# Patient Record
Sex: Male | Born: 1995 | Race: White | Hispanic: No | Marital: Single | Smoking: Never smoker
Health system: Southern US, Community
[De-identification: ages and names within clinical notes are randomized; demographics above are authoritative.]

## PROBLEM LIST (undated history)

## (undated) DIAGNOSIS — J189 Pneumonia, unspecified organism: Secondary | ICD-10-CM

## (undated) DIAGNOSIS — J45909 Unspecified asthma, uncomplicated: Secondary | ICD-10-CM

## (undated) DIAGNOSIS — D573 Sickle-cell trait: Secondary | ICD-10-CM

## (undated) DIAGNOSIS — G4730 Sleep apnea, unspecified: Secondary | ICD-10-CM

## (undated) DIAGNOSIS — T7840XA Allergy, unspecified, initial encounter: Secondary | ICD-10-CM

## (undated) DIAGNOSIS — I10 Essential (primary) hypertension: Secondary | ICD-10-CM

## (undated) HISTORY — DX: Allergy, unspecified, initial encounter: T78.40XA

## (undated) HISTORY — PX: TONSILLECTOMY AND ADENOIDECTOMY: SUR1326

## (undated) HISTORY — DX: Sickle-cell trait: D57.3

## (undated) HISTORY — DX: Essential (primary) hypertension: I10

## (undated) HISTORY — DX: Unspecified asthma, uncomplicated: J45.909

## (undated) HISTORY — DX: Sleep apnea, unspecified: G47.30

## (undated) HISTORY — PX: FRACTURE SURGERY: SHX138

---
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
## 2014-02-27 ENCOUNTER — Ambulatory Visit (INDEPENDENT_AMBULATORY_CARE_PROVIDER_SITE_OTHER): Payer: BC Managed Care – PPO | Admitting: Family Medicine

## 2014-02-27 ENCOUNTER — Encounter: Payer: Self-pay | Admitting: Family Medicine

## 2014-02-27 VITALS — BP 159/107 | HR 110 | Temp 98.0°F | Ht 65.75 in | Wt 312.0 lb

## 2014-02-27 DIAGNOSIS — R0681 Apnea, not elsewhere classified: Secondary | ICD-10-CM

## 2014-02-27 DIAGNOSIS — I10 Essential (primary) hypertension: Secondary | ICD-10-CM

## 2014-02-27 DIAGNOSIS — R5383 Other fatigue: Secondary | ICD-10-CM

## 2014-02-27 DIAGNOSIS — Z139 Encounter for screening, unspecified: Secondary | ICD-10-CM

## 2014-02-27 DIAGNOSIS — R5381 Other malaise: Secondary | ICD-10-CM

## 2014-02-27 MED ORDER — LISINOPRIL 10 MG PO TABS: 10.0000 mg | ORAL_TABLET | Freq: Every day | ORAL | Status: DC

## 2014-02-27 NOTE — Progress Notes (Signed)
   Subjective:    Patient ID: Mason Walker, male    DOB: 24-May-1996, 18 y.o.   MRN: [id number]  HPI C/o sleep problems and loud snoring and sleep apnea and he is accompanied by his mother Who states she has witnessed him stop breathing for 15 to 20 seconds.  He is falling asleep throughout the day and in the interview he falls asleep a few times inbetween talking to him and his mother.   Review of Systems C/o snoring, somnolence   No chest pain, SOB, HA, dizziness, vision change, N/V, diarrhea, constipation, dysuria, urinary urgency or frequency, myalgias, arthralgias or rash.  Objective:   Physical Exam  Vital signs noted  Tired appearing obese male in NAD.  HEENT - Head atraumatic Normocephalic                Eyes - PERRLA, Conjuctiva - clear Sclera- Clear EOMI                Ears - EAC's Wnl TM's Wnl Gross Hearing WNL                Throat - oropharanx wnl Respiratory - Lungs CTA bilateral Cardiac - RRR S1 and S2 without murmur GI - Abdomen soft Nontender and bowel sounds active x 4 Extremities - No edema. Neuro - Grossly intact.      Assessment & Plan:  Fatigue - Plan: POCT CBC, CMP14+EGFR, Vit D  25 hydroxy (rtn osteoporosis monitoring), Thyroid Panel With TSH  Apnea - Plan: Ambulatory referral to Pulmonology, CANCELED: Ambulatory referral to Pulmonology  Screening - Plan: Lipid panel  Essential hypertension, benign - Plan: lisinopril (PRINIVIL,ZESTRIL) 10 MG tablet  Lysbeth Penner FNP

## 2014-02-28 ENCOUNTER — Encounter: Payer: Self-pay | Admitting: Pulmonary Disease

## 2014-02-28 ENCOUNTER — Ambulatory Visit (INDEPENDENT_AMBULATORY_CARE_PROVIDER_SITE_OTHER): Payer: BC Managed Care – PPO | Admitting: Pulmonary Disease

## 2014-02-28 ENCOUNTER — Encounter (INDEPENDENT_AMBULATORY_CARE_PROVIDER_SITE_OTHER): Payer: Self-pay

## 2014-02-28 VITALS — BP 148/98 | HR 112 | Temp 98.6°F | Ht 66.0 in | Wt 315.4 lb

## 2014-02-28 DIAGNOSIS — G4733 Obstructive sleep apnea (adult) (pediatric): Secondary | ICD-10-CM

## 2014-02-28 NOTE — Patient Instructions (Signed)
Will schedule for home sleep testing, and will call once the results are available. Work on weight loss.  

## 2014-02-28 NOTE — Progress Notes (Signed)
Subjective:    Patient ID: Mason CelesteJacob Walker, male    DOB: 03/06/1996, 18 y.o.   MRN: [id number]  HPI The patient is a 18 year old male who I've been asked to see for possible obstructive sleep apnea. The patient has been noted to have loud snoring as well as witnessed apneas by his parent. He also describes choking arousals at night, sometimes associated with severe coughing and regurgitation. He has frequent awakenings at night, and is not rested in the mornings upon arising. He has profound sleepiness during the day with inactivity, and falls asleep in class. His mother has received e-mails from his teachers in college. He will also fall asleep in the evenings watching television or movies, and also has sleepiness driving longer distances. Of note, the patient states his weight is up 80 pounds over the last 2 years, and his Epworth score today is 14.    Sleep Questionnaire What time do you typically go to bed?( Between what hours) 10-11pm 10-11pm at 1615 on 02/28/14 by Darrell JewelJennifer R Castillo, CMA How long does it take you to fall asleep? 1 hour 1 hour at 1615 on 02/28/14 by Darrell JewelJennifer R Castillo, CMA How many times during the night do you wake up? 4 4 at 1615 on 02/28/14 by Darrell JewelJennifer R Castillo, CMA What time do you get out of bed to start your day? 0800 0800 at 1615 on 02/28/14 by Darrell JewelJennifer R Castillo, CMA Do you drive or operate heavy machinery in your occupation? No No at 1615 on 02/28/14 by Darrell JewelJennifer R Castillo, CMA How much has your weight changed (up or down) over the past two years? (In pounds) 80 lb (36.288 kg) 80 lb (36.288 kg) at 1615 on 02/28/14 by Darrell JewelJennifer R Castillo, CMA Have you ever had a sleep study before? No No at 1615 on 02/28/14 by Darrell JewelJennifer R Castillo, CMA Do you currently use CPAP? No No at 1615 on 02/28/14 by Darrell JewelJennifer R Castillo, CMA Do you wear oxygen at any time? No No at 1615 on 02/28/14 by Darrell JewelJennifer R Castillo, CMA   Review of Systems  Constitutional:  Negative for fever and unexpected weight change.  HENT: Positive for congestion. Negative for dental problem, ear pain, nosebleeds, postnasal drip, rhinorrhea, sinus pressure, sneezing, sore throat and trouble swallowing.   Eyes: Negative for redness and itching.  Respiratory: Positive for shortness of breath. Negative for cough, chest tightness and wheezing.   Cardiovascular: Negative for palpitations and leg swelling.  Gastrointestinal: Negative for nausea and vomiting.  Genitourinary: Negative for dysuria.  Musculoskeletal: Negative for joint swelling.  Skin: Negative for rash.  Neurological: Negative for headaches.  Hematological: Does not bruise/bleed easily.  Psychiatric/Behavioral: Positive for dysphoric mood. The patient is nervous/anxious.        Objective:   Physical Exam Constitutional:  Morbidly obese male, no acute distress  HENT:  Nares patent without discharge, mild edema  Oropharynx without exudate, palate and uvula are moderately elongated.   Eyes:  Perrla, eomi, no scleral icterus  Neck:  No JVD, no TMG  Cardiovascular:  Normal rate, regular rhythm, no rubs or gallops.  No murmurs        Intact distal pulses  Pulmonary :  Normal breath sounds, no stridor or respiratory distress   No rales, rhonchi, or wheezing  Abdominal:  Soft, nondistended, bowel sounds present.  No tenderness noted.   Musculoskeletal:  No lower extremity edema noted.  Lymph Nodes:  No cervical lymphadenopathy noted  Skin:  No cyanosis noted  Neurologic:  Appears sleepy but appropriate, moves all 4 extremities without obvious deficit.         Assessment & Plan:

## 2014-02-28 NOTE — Assessment & Plan Note (Signed)
The patient's history is classic for clinically significant obstructive sleep apnea. I've had a long discussion with him about the pathophysiology of sleep apnea, including its impact to his quality of life and cardiovascular health. He will need a sleep study for diagnosis, and he is an excellent candidate for home sleep testing

## 2014-05-06 ENCOUNTER — Telehealth: Payer: Self-pay | Admitting: Pulmonary Disease

## 2014-05-06 NOTE — Telephone Encounter (Signed)
I had called & spoke with pt on 03/29/14 to try to set up his HST appt.  Pt stated that he thought he already had his CPAP & that he thought "we skipped this step".  However, he stated he would check to be sure he did already have a CPAP machine & call me back.  Pt was given my direct phone number.  On 04/04/14 I tried to reach pt but got VM on both his home & cell numbers.  I left a VM asking him to call back to verify if he did have his CPAP and if not, I would schedule an appt for him to pick up one of our machines.   I had not received any call back or messages from the patient regarding if he still needed to schedule a HST or if he already has a CPAP.  I called the pt today (05/06/14) on his cell and reached him.  He stated that he does already have his CPAP.  He obtained this through a DME in [place] and this was ordered by "one of [facility] neurolog[facility]".  I told pt we will cancel the HST order for here since he already has his machine.  Lucilla Edinawne J Law

## 2014-05-06 NOTE — Telephone Encounter (Signed)
Cannot prescribe any cpap until I have his sleep study from [facility] for review.

## 2014-05-07 NOTE — Telephone Encounter (Signed)
I have LMTCBx1. If the pt already has a CPAP from another MD is he going to f/u here? If he had sleep study and was prescribed cpap through another MD then he should follow-up with them? Carron CurieJennifer Castillo, CMA

## 2014-05-07 NOTE — Telephone Encounter (Signed)
I spoke with pt mother and she states he went to [employer] [facility] and they [facility] and he has followed with them for cpap. She was not aware that any of this had been done at the time of his OV with Dr. Shelle Ironlance. [facility] is [facility]. Nothing further needed here. Carron CurieJennifer Ashon Rosenberg, CMA

## 2016-02-12 ENCOUNTER — Encounter: Payer: Self-pay | Admitting: Family Medicine

## 2016-02-12 ENCOUNTER — Ambulatory Visit (INDEPENDENT_AMBULATORY_CARE_PROVIDER_SITE_OTHER): Payer: BC Managed Care – PPO | Admitting: Family Medicine

## 2016-02-12 VITALS — BP 124/66 | HR 105 | Temp 97.5°F | Ht 66.0 in | Wt 326.4 lb

## 2016-02-12 DIAGNOSIS — F39 Unspecified mood [affective] disorder: Secondary | ICD-10-CM

## 2016-02-12 DIAGNOSIS — R002 Palpitations: Secondary | ICD-10-CM | POA: Diagnosis not present

## 2016-02-12 NOTE — Progress Notes (Signed)
   HPI  Patient presents today here requesting ADHD medications.  Patient explains that in 2060 quit taking ADHD medications and since that time he struggled with inattention.  He was previously a [employer] due to when he had to leave for mental health reasons. He states that at that time he was having suicidal thoughts but have no attempts. He's been treated with a mood stabilizer previously but never told that he had bipolar disorder, anxiety, or depression.  He was previously treated with stimulants for ADHD.  He complains of inattention trying to read. Also some fidgeting and hyperactivity. He is willing to go psychiatry.  He endorses passive suicidal thoughts remotely, he states that he is not having any of those thoughts currently, he's never had any overt suicidal attempts, and he does not want to kill himself. He checks for safety  Palpitations Describes long-term issues with palpitations, describes sensation of heart stopping and then pounding back to life. He states that he does have mild shortness of breath with these episodes and chest pain with the first beat when he returns. He denies any syncopal episode, sweating He has no exertional chest pain, shortness of breath, or palpitations These episodes occur at random but are pretty frequent happening approximately once every 2 days or so  PMH: Smoking status noted ROS: Per HPI  Objective: BP 124/66 mmHg  Pulse 105  Temp(Src) 97.5 F (36.4 C) (Oral)  Ht 5\' 6"  (1.676 m)  Wt 326 lb 6.4 oz (148.054 kg)  BMI 52.71 kg/m2 Gen: NAD, alert, cooperative with exam HEENT: NCAT, EOMI, PERRL CV: RRR, good S1/S2, no murmur Resp: CTABL, no wheezes, non-labored Abd: SNTND, BS present, no guarding or organomegaly Ext: No edema, warm Neuro: Alert and oriented, No gross deficits  Psych: Appropriate mood and affect, contracts for safety, passive suicidal thoughts in the past  [facility] 02/12/2016 02/27/2014  Decreased  Interest 2 0  Down, Depressed, Hopeless 2 1  PHQ - 2 Score 4 1  Altered sleeping 3 -  Tired, decreased energy 3 -  Change in appetite 3 -  Feeling bad or failure about yourself  3 -  Trouble concentrating 3 -  Moving slowly or fidgety/restless 3 -  Suicidal thoughts 2 -  PHQ-9 Score 24 -      Assessment and plan:  # Mood disorder Possibly has ADHD, however I am much more concerned about severe depression or bipolar disorder I given him the name of a local psychiatrist as well as a list of local providers, refer to psychiatry He contracts for safety, follow-up as needed  # Palpitations Likely anxiety related Offered EKG labs and Holter monitor today today he is very concerned about the cost, he will meet with our financial department and get back to me if he would like workup. The meantime I have reviewed red flags and reasons to seek emergency medical care which he understands   Orders Placed This Encounter  Procedures  . Ambulatory referral to Psychiatry    Referral Priority:  Routine    Referral Type:  Psychiatric    Referral Reason:  Specialty Services Required    Requested Specialty:  Psychiatry    Number of Visits Requested:  1    No orders of the defined types were placed in this encounter.    Murtis SinkSam Arneta Mahmood, MD [facility] 02/12/2016, 8:40 AM

## 2016-02-12 NOTE — Patient Instructions (Signed)
Great to meet you!  Consider calling a psychiatrist for treatment of you rmood disorder and ADHD evaluation  Mason Walker (([phone]) [phone]) is someone we talked about, also look at the list  Come back to discuss palpitations if you are interested in pursuing more evaluation

## 2016-04-14 ENCOUNTER — Ambulatory Visit (INDEPENDENT_AMBULATORY_CARE_PROVIDER_SITE_OTHER): Payer: BC Managed Care – PPO | Admitting: Family Medicine

## 2016-04-14 ENCOUNTER — Encounter: Payer: Self-pay | Admitting: Family Medicine

## 2016-04-14 VITALS — BP 119/79 | HR 77 | Temp 97.3°F | Ht 66.0 in | Wt 334.0 lb

## 2016-04-14 DIAGNOSIS — Z111 Encounter for screening for respiratory tuberculosis: Secondary | ICD-10-CM | POA: Diagnosis not present

## 2016-04-14 DIAGNOSIS — F900 Attention-deficit hyperactivity disorder, predominantly inattentive type: Secondary | ICD-10-CM

## 2016-04-14 DIAGNOSIS — Z0000 Encounter for general adult medical examination without abnormal findings: Secondary | ICD-10-CM

## 2016-04-14 MED ORDER — LISDEXAMFETAMINE DIMESYLATE 30 MG PO CAPS: 30.0000 mg | ORAL_CAPSULE | Freq: Every day | ORAL | Status: DC

## 2016-04-14 MED ORDER — TUBERCULIN PPD 5 UNIT/0.1ML ID SOLN
5.0000 [IU] | Freq: Once | INTRADERMAL | Status: AC
  Administered 2016-04-14: 5 [IU] via INTRADERMAL

## 2016-04-14 NOTE — Progress Notes (Signed)
   Subjective:    Patient ID: Mason Walker, male    DOB: 03-May-1996, 20 y.o.   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 The form is fairly standard for a general exam. He also asked for medicine for ADHD. He has never seen psychiatry. I let him complete the form of a screening form and he rates high enough to be suspicious for ADHD. Basically he has problems completing assignments and reading  . He has never been treated for this before.  Patient Active Problem List   Diagnosis Date Noted  . Mood disorder (HCC) 02/12/2016  . OSA (obstructive sleep apnea) 02/28/2014   No outpatient encounter prescriptions on file as of 04/14/2016.   No facility-administered encounter medications on file as of 04/14/2016.      Review of Systems  Psychiatric/Behavioral: Positive for decreased concentration.       Objective:   Physical Exam  Constitutional: He is oriented to person, place, and time. He appears well-developed.  Very overweight  HENT:  Head: Normocephalic.  Neck: Normal range of motion.  Cardiovascular: Normal rate, regular rhythm, normal heart sounds and intact distal pulses.   Pulmonary/Chest: Breath sounds normal.  Abdominal: Soft. There is no tenderness.  Musculoskeletal: Normal range of motion.  Neurological: He is alert and oriented to person, place, and time. He has normal reflexes.  Psychiatric: He has a normal mood and affect.   BP 119/79 mmHg  Pulse 77  Temp(Src) 97.3 F (36.3 C) (Oral)  Ht 5\' 6"  (1.676 m)  Wt 334 lb (151.501 kg)  BMI 53.93 kg/m2        Assessment & Plan:  1. Routine general medical examination at a health care facility Within normal limits except for obesity. He tells me this is a familial finding - tuberculin injection 5 Units; Inject 0.1 mLs (5 Units total) into the skin once.  2. Attention deficit hyperactivity disorder (ADHD), predominantly inattentive type He tells  me he cannot afford psychiatric evaluation and would like to try medicine for ADHD. Screening tool suggests he does have a problem so I will begin Vyvanse 30 mg daily and see him back in one month  Frederica KusterStephen M Miller MD

## 2016-04-16 ENCOUNTER — Encounter: Payer: Self-pay | Admitting: *Deleted

## 2016-04-16 LAB — TB SKIN TEST
Induration: 0 mm
TB Skin Test: NEGATIVE

## 2016-04-16 NOTE — Addendum Note (Signed)
Addended by: Fawn KirkHOLT, CATHY on: 04/16/2016 11:57 AM   Modules accepted: Orders, SmartSet

## 2016-05-06 ENCOUNTER — Telehealth: Payer: Self-pay | Admitting: Family Medicine

## 2016-05-11 NOTE — Telephone Encounter (Signed)
Copy of Letter faxed to [facility].

## 2016-05-20 ENCOUNTER — Encounter: Payer: Self-pay | Admitting: Family Medicine

## 2016-05-20 ENCOUNTER — Ambulatory Visit (INDEPENDENT_AMBULATORY_CARE_PROVIDER_SITE_OTHER): Payer: BC Managed Care – PPO | Admitting: Family Medicine

## 2016-05-20 VITALS — BP 137/87 | HR 106 | Temp 98.8°F | Ht 66.0 in | Wt 336.0 lb

## 2016-05-20 DIAGNOSIS — F39 Unspecified mood [affective] disorder: Secondary | ICD-10-CM

## 2016-05-20 MED ORDER — LISDEXAMFETAMINE DIMESYLATE 40 MG PO CAPS: 40.0000 mg | ORAL_CAPSULE | ORAL | 0 refills | Status: DC

## 2016-05-20 MED ORDER — LISDEXAMFETAMINE DIMESYLATE 30 MG PO CAPS: 30.0000 mg | ORAL_CAPSULE | Freq: Every day | ORAL | 0 refills | Status: DC

## 2016-05-20 NOTE — Progress Notes (Signed)
   Subjective:    Patient ID: Mason Walker, male    DOB: [birth year], 20 y.o.   MRN: [id number]  HPI  Patient here today for 1 month follow up on medication.  Patient has had marked improvement in symptoms on this medicine. There are no side effects such as sleep disturbance or appetite disturbance that he is noted but generally impact has been very positive.    Patient Active Problem List   Diagnosis Date Noted  . Mood disorder (HCC) 02/12/2016  . OSA (obstructive sleep apnea) 02/28/2014   Outpatient Encounter Prescriptions as of 05/20/2016  Medication Sig  . lisdexamfetamine (VYVANSE) 30 MG capsule Take 1 capsule (30 mg total) by mouth daily.   No facility-administered encounter medications on file as of 05/20/2016.      Review of Systems  Constitutional: Negative.   HENT: Negative.   Eyes: Negative.   Respiratory: Negative.   Cardiovascular: Negative.   Gastrointestinal: Negative.   Endocrine: Negative.   Genitourinary: Negative.   Musculoskeletal: Negative.   Skin: Negative.   Allergic/Immunologic: Negative.   Neurological: Negative.   Hematological: Negative.   Psychiatric/Behavioral: Negative.        Objective:   Physical Exam  Constitutional: He is oriented to person, place, and time. He appears well-developed and well-nourished.  Cardiovascular: Normal rate, regular rhythm and normal heart sounds.   Pulmonary/Chest: Effort normal and breath sounds normal.  Neurological: He is alert and oriented to person, place, and time.  Psychiatric: He has a normal mood and affect. His behavior is normal.    BP 137/87 (BP Location: Left Arm)   Pulse (!) 106   Temp 98.8 F (37.1 C) (Oral)   Ht 5\' 6"  (1.676 m)   Wt (!) 336 lb (152.4 kg)   BMI 54.23 kg/m        Assessment & Plan:  1. Mood disorder (HCC) This is another term for ADHD. When I asked him to do the ADHD screening tool he still reports difficulties even though he has seen a big difference. I am inclined  to increase dose of Vyvanse from 30-40 mg and follow  Frederica Kuster MD .

## 2016-08-13 ENCOUNTER — Ambulatory Visit (INDEPENDENT_AMBULATORY_CARE_PROVIDER_SITE_OTHER): Payer: BC Managed Care – PPO | Admitting: Family Medicine

## 2016-08-13 ENCOUNTER — Encounter: Payer: Self-pay | Admitting: Family Medicine

## 2016-08-13 VITALS — BP 141/92 | HR 88 | Temp 97.7°F | Ht 66.0 in | Wt 352.2 lb

## 2016-08-13 DIAGNOSIS — F39 Unspecified mood [affective] disorder: Secondary | ICD-10-CM | POA: Diagnosis not present

## 2016-08-13 DIAGNOSIS — R030 Elevated blood-pressure reading, without diagnosis of hypertension: Secondary | ICD-10-CM | POA: Diagnosis not present

## 2016-08-13 DIAGNOSIS — M25562 Pain in left knee: Secondary | ICD-10-CM

## 2016-08-13 MED ORDER — LISDEXAMFETAMINE DIMESYLATE 40 MG PO CAPS: 40.0000 mg | ORAL_CAPSULE | ORAL | 0 refills | Status: DC

## 2016-08-13 NOTE — Patient Instructions (Signed)
Continue current medications. Continue good therapeutic lifestyle changes which include good diet and exercise. Fall precautions discussed with patient. If an FOBT was given today- please return it to our front desk. If you are over 22 years old - you may need Prevnar 13 or the adult Pneumonia vaccine.   After your visit with us today you will receive a survey in the mail or online from [employer] regarding your care with us. Please take a moment to fill this out. Your feedback is very important to us as you can help us better understand your patient needs as well as improve your experience and satisfaction. WE CARE ABOUT YOU!!!    

## 2016-08-13 NOTE — Addendum Note (Signed)
Addended by: Quay BurowPOTTER, Romonda Parker K on: 08/13/2016 02:32 PM   Modules accepted: Orders

## 2016-08-13 NOTE — Progress Notes (Signed)
   Subjective:    Patient ID: Mason Walker, male    DOB: 10-Mar-1996, 20 y.o.   MRN: [id number]  HPI   Patient is here today for followup of mood disorder.  He is also concerned about pain he is having in his left knee. Patient doing well with Vyvanse. He complains of pain in his left knee on the lateral aspect. There is no grinding or giving way or locking. Symptoms seem to be related more to increase activity. Patient Active Problem List   Diagnosis Date Noted  . Mood disorder ([place]) 02/12/2016  . OSA (obstructive sleep apnea) 02/28/2014   Outpatient Encounter Prescriptions as of 08/13/2016  Medication Sig  . lisdexamfetamine (VYVANSE) 40 MG capsule Take 1 capsule (40 mg total) by mouth every morning.  . lisdexamfetamine (VYVANSE) 40 MG capsule Take 1 capsule (40 mg total) by mouth every morning.  . lisdexamfetamine (VYVANSE) 40 MG capsule Take 1 capsule (40 mg total) by mouth every morning.   No facility-administered encounter medications on file as of 08/13/2016.        Review of Systems  Constitutional: Negative.   HENT: Negative.   Eyes: Negative.   Respiratory: Negative.   Cardiovascular: Negative.   Gastrointestinal: Negative.   Endocrine: Negative.   Genitourinary: Negative.   Musculoskeletal: Positive for arthralgias.  Skin: Negative.   Allergic/Immunologic: Negative.   Neurological: Negative.   Hematological: Negative.   Psychiatric/Behavioral: Negative.           Objective:   Physical Exam  Constitutional: He appears well-developed and well-nourished.  Patient is obese  Musculoskeletal:  Left knee: There is no effusion. There is some mild tenderness along the lateral aspect. Joint stability is intact with valgus and varus stress.    BP (!) 148/90   Pulse 94   Temp 97.7 F (36.5 C) (Oral)   Ht '5\' 6"'$  (1.676 m)   Wt (!) 352 lb 4 oz (159.8 kg)   BMI 56.85 kg/m          Assessment & Plan:  1. Mood disorder ([place]) Into new with Vyvanse as  before for 3 months. Drug contract executed a day - CMP14+EGFR - Lipid panel  2. Acute pain of left knee Suspect iliotibial band syndrome. Suggested Aleve and some exercises      3. Elevated blood pressure reading Have recommended weight reduction and decrease sodium intake. He does have family history.  Wardell Honour MD

## 2016-08-21 LAB — TOXASSURE SELECT 13 (MW), URINE

## 2016-10-22 ENCOUNTER — Encounter: Payer: Self-pay | Admitting: Family Medicine

## 2016-10-22 ENCOUNTER — Ambulatory Visit (INDEPENDENT_AMBULATORY_CARE_PROVIDER_SITE_OTHER): Payer: BC Managed Care – PPO | Admitting: Family Medicine

## 2016-10-22 VITALS — BP 134/83 | HR 94 | Temp 97.2°F | Ht 66.0 in | Wt 360.0 lb

## 2016-10-22 DIAGNOSIS — F39 Unspecified mood [affective] disorder: Secondary | ICD-10-CM | POA: Diagnosis not present

## 2016-10-22 DIAGNOSIS — J0100 Acute maxillary sinusitis, unspecified: Secondary | ICD-10-CM

## 2016-10-22 MED ORDER — AMOXICILLIN 875 MG PO TABS: 875.0000 mg | ORAL_TABLET | Freq: Two times a day (BID) | ORAL | 0 refills | Status: DC

## 2016-10-22 MED ORDER — LISDEXAMFETAMINE DIMESYLATE 40 MG PO CAPS: 40.0000 mg | ORAL_CAPSULE | ORAL | 0 refills | Status: DC

## 2016-10-22 NOTE — Progress Notes (Signed)
   Subjective:    Patient ID: Mason Walker, male    DOB: 1996-03-28, 21 y.o.   MRN: [id number]  HPI Patient here today for follow up on meds and mood disorder.He has not had his Vyvanse for some time since it was accidentally thrown out.  Also complains of some congestion and cough he thinks it may be due to allergies. Also complains of ears feeling stopped up ears symptoms present for 1-1/2 weeks    Patient Active Problem List   Diagnosis Date Noted  . Mood disorder (HCC) 02/12/2016  . OSA (obstructive sleep apnea) 02/28/2014   Outpatient Encounter Prescriptions as of 10/22/2016  Medication Sig  . lisdexamfetamine (VYVANSE) 40 MG capsule Take 1 capsule (40 mg total) by mouth every morning. (Patient not taking: Reported on 10/22/2016)  . lisdexamfetamine (VYVANSE) 40 MG capsule Take 1 capsule (40 mg total) by mouth every morning. (Patient not taking: Reported on 10/22/2016)  . lisdexamfetamine (VYVANSE) 40 MG capsule Take 1 capsule (40 mg total) by mouth every morning. (Patient not taking: Reported on 10/22/2016)   No facility-administered encounter medications on file as of 10/22/2016.        Review of Systems  Constitutional: Negative.   HENT: Positive for congestion.   Eyes: Negative.   Respiratory: Positive for cough.   Cardiovascular: Negative.   Gastrointestinal: Negative.   Endocrine: Negative.   Genitourinary: Negative.   Musculoskeletal: Negative.   Skin: Negative.   Allergic/Immunologic: Negative.   Neurological: Negative.   Hematological: Negative.   Psychiatric/Behavioral: Negative.        Objective:   Physical Exam  Constitutional: He is oriented to person, place, and time. He appears well-developed and well-nourished.  HENT:  Both tympanic membranes are dull and reddened There is also thick postnasal drainage in the oropharynx  Cardiovascular: Normal rate and regular rhythm.   Pulmonary/Chest: Effort normal and breath sounds normal.  Neurological: He is  alert and oriented to person, place, and time.   BP 134/83 (BP Location: Right Wrist)   Pulse 94   Temp 97.2 F (36.2 C) (Oral)   Ht 5\' 6"  (1.676 m)   Wt (!) 360 lb (163.3 kg)   BMI 58.11 kg/m         Assessment & Plan:  1. Mood disorder (HCC) Feel Vyvanse. He says that it was effective while he took it  2. Acute non-recurrent maxillary sinusitis Amoxicillin 875 twice a day. Also recommend Flonase  Frederica KusterStephen M Miller MD

## 2018-10-18 DIAGNOSIS — J189 Pneumonia, unspecified organism: Secondary | ICD-10-CM

## 2018-10-18 HISTORY — DX: Pneumonia, unspecified organism: J18.9

## 2018-10-19 ENCOUNTER — Ambulatory Visit (INDEPENDENT_AMBULATORY_CARE_PROVIDER_SITE_OTHER): Payer: BLUE CROSS/BLUE SHIELD | Admitting: Physician Assistant

## 2018-10-19 ENCOUNTER — Encounter: Payer: Self-pay | Admitting: Physician Assistant

## 2018-10-19 VITALS — BP 145/96 | HR 102 | Temp 97.4°F | Ht 66.0 in | Wt 380.0 lb

## 2018-10-19 DIAGNOSIS — J209 Acute bronchitis, unspecified: Secondary | ICD-10-CM

## 2018-10-19 MED ORDER — AMOXICILLIN 875 MG PO TABS: 875.0000 mg | ORAL_TABLET | Freq: Two times a day (BID) | ORAL | 0 refills | Status: DC

## 2018-10-19 MED ORDER — AZITHROMYCIN 250 MG PO TABS
ORAL_TABLET | ORAL | 0 refills | Status: DC
Start: 2018-10-19 — End: 2018-11-13

## 2018-10-19 MED ORDER — PREDNISONE 10 MG (21) PO TBPK: ORAL_TABLET | ORAL | 0 refills | Status: DC

## 2018-10-20 ENCOUNTER — Ambulatory Visit: Payer: BC Managed Care – PPO | Admitting: Family Medicine

## 2018-10-22 NOTE — Progress Notes (Signed)
BP (!) 145/96   Pulse (!) 102   Temp (!) 97.4 F (36.3 C) (Oral)   Ht 5\' 6"  (1.676 m)   Wt (!) 380 lb (172.4 kg)   BMI 61.33 kg/m    Subjective:    Patient ID: Mason Walker, male    DOB: Jan 16, 1996, 23 y.o.   MRN: [id number]  HPI: Mason Walker is a 23 y.o. male presenting on 10/19/2018 for Sinusitis and Cough  Patient with several days of progressing upper respiratory and bronchial symptoms. Initially there was more upper respiratory congestion. This progressed to having significant cough that is productive throughout the day and severe at night. There is occasional wheezing after coughing. Sometimes there is slight dyspnea on exertion. It is productive mucus that is yellow in color. Denies any blood.   Past Medical History:  Diagnosis Date  . Allergy   . Asthma   . HTN (hypertension)   . Sickle cell trait (HCC)   . Sleep apnea    Relevant past medical, surgical, family and social history reviewed and updated as indicated. Interim medical history since our last visit reviewed. Allergies and medications reviewed and updated. DATA REVIEWED: CHART IN EPIC  Family History reviewed for pertinent findings.  Review of Systems  Constitutional: Positive for fatigue. Negative for appetite change and fever.  HENT: Positive for sinus pressure and sore throat.   Eyes: Negative.  Negative for pain and visual disturbance.  Respiratory: Positive for cough. Negative for chest tightness and shortness of breath.   Cardiovascular: Negative.  Negative for chest pain, palpitations and leg swelling.  Gastrointestinal: Negative.  Negative for abdominal pain, diarrhea, nausea and vomiting.  Endocrine: Negative.   Genitourinary: Negative.   Musculoskeletal: Positive for back pain and myalgias.  Skin: Negative.  Negative for color change and rash.  Neurological: Positive for headaches. Negative for weakness and numbness.  Psychiatric/Behavioral: Negative.     Allergies as of 10/19/2018   No  Known Allergies     Medication List       Accurate as of October 19, 2018 11:59 PM. Always use your most recent med list.        amoxicillin 875 MG tablet Commonly known as:  AMOXIL Take 1 tablet (875 mg total) by mouth 2 (two) times daily.   azithromycin 250 MG tablet Commonly known as:  ZITHROMAX Z-PAK Take as directed   predniSONE 10 MG (21) Tbpk tablet Commonly known as:  STERAPRED UNI-PAK 21 TAB As directed x 6 days          Objective:    BP (!) 145/96   Pulse (!) 102   Temp (!) 97.4 F (36.3 C) (Oral)   Ht 5\' 6"  (1.676 m)   Wt (!) 380 lb (172.4 kg)   BMI 61.33 kg/m   No Known Allergies  Wt Readings from Last 3 Encounters:  10/19/18 (!) 380 lb (172.4 kg)  10/22/16 (!) 360 lb (163.3 kg)  08/13/16 (!) 352 lb 4 oz (159.8 kg)    Physical Exam Constitutional:      Appearance: He is well-developed.  HENT:     Head: Normocephalic and atraumatic.     Right Ear: Hearing and tympanic membrane normal.     Left Ear: Hearing and tympanic membrane normal.     Nose: Mucosal edema present. No nasal deformity.     Right Sinus: Frontal sinus tenderness present.     Left Sinus: Frontal sinus tenderness present.     Mouth/Throat:  Pharynx: Posterior oropharyngeal erythema present.  Eyes:     General:        Right eye: No discharge.        Left eye: No discharge.     Conjunctiva/sclera: Conjunctivae normal.     Pupils: Pupils are equal, round, and reactive to light.  Neck:     Musculoskeletal: Normal range of motion and neck supple.  Cardiovascular:     Rate and Rhythm: Normal rate and regular rhythm.     Heart sounds: Normal heart sounds.  Pulmonary:     Effort: Pulmonary effort is normal. No respiratory distress.     Breath sounds: Wheezing present. No decreased breath sounds, rhonchi or rales.  Abdominal:     General: Bowel sounds are normal.     Palpations: Abdomen is soft.  Musculoskeletal: Normal range of motion.  Skin:    General: Skin is warm and  dry.     Results for orders placed or performed in visit on 08/13/16  ToxASSURE Select 13 (MW), Urine  Result Value Ref Range   ToxAssure Select 13 FINAL       Assessment & Plan:   1. Acute bronchitis, unspecified organism - amoxicillin (AMOXIL) 875 MG tablet; Take 1 tablet (875 mg total) by mouth 2 (two) times daily.  Dispense: 20 tablet; Refill: 0 - azithromycin (ZITHROMAX Z-PAK) 250 MG tablet; Take as directed  Dispense: 6 each; Refill: 0 - predniSONE (STERAPRED UNI-PAK 21 TAB) 10 MG (21) TBPK tablet; As directed x 6 days  Dispense: 21 tablet; Refill: 0   Continue all other maintenance medications as listed above.  Follow up plan: No follow-ups on file.  Educational handout given for survey  Remus Loffler PA-C [facility] [address]  [place], [place] [address] [phone]   10/22/2018, 4:15 PM

## 2018-11-13 ENCOUNTER — Ambulatory Visit (INDEPENDENT_AMBULATORY_CARE_PROVIDER_SITE_OTHER): Payer: BLUE CROSS/BLUE SHIELD | Admitting: Family Medicine

## 2018-11-13 ENCOUNTER — Encounter: Payer: Self-pay | Admitting: Family Medicine

## 2018-11-13 VITALS — BP 125/81 | HR 78 | Temp 97.1°F | Ht 66.0 in | Wt 385.0 lb

## 2018-11-13 DIAGNOSIS — H938X1 Other specified disorders of right ear: Secondary | ICD-10-CM | POA: Diagnosis not present

## 2018-11-13 DIAGNOSIS — J329 Chronic sinusitis, unspecified: Secondary | ICD-10-CM | POA: Diagnosis not present

## 2018-11-13 DIAGNOSIS — H9191 Unspecified hearing loss, right ear: Secondary | ICD-10-CM

## 2018-11-13 MED ORDER — BENZONATATE 100 MG PO CAPS: 100.0000 mg | ORAL_CAPSULE | Freq: Three times a day (TID) | ORAL | 0 refills | Status: DC | PRN

## 2018-11-13 MED ORDER — AZELASTINE HCL 0.1 % NA SOLN: 2.0000 | Freq: Two times a day (BID) | NASAL | 12 refills | Status: DC

## 2018-11-13 MED ORDER — CETIRIZINE HCL 10 MG PO TABS: 10.0000 mg | ORAL_TABLET | Freq: Every day | ORAL | 11 refills | Status: DC

## 2018-11-13 NOTE — Patient Instructions (Signed)
There is nothing on exam to suggest that you have an ongoing infection.  I think that the medications have worked.  However, your exam does demonstrate evidence of persistent drainage and therefore I have prescribed Zyrtec, Astelin nasal spray and Tessalon Perles.  The [employer]on Perles will help with the cough.  Astelin should dry up any nasal drainage you have going on and hopefully relieve some of that fullness you have in the right ear.  The Zyrtec will also help with drainage and other allergy symptoms.  Take that pill at nighttime, as it can make you sleepy.  If you feel that symptoms are not substantially improving in the next couple of weeks, contact me.  If the hearing does not improve while the other symptoms improve, contact me and I can place a referral to the audiologist.   Allergic Rhinitis, Adult Allergic rhinitis is a reaction to allergens in the air. Allergens are tiny specks (particles) in the air that cause your body to have an allergic reaction. This condition cannot be passed from person to person (is not contagious). Allergic rhinitis cannot be cured, but it can be controlled. There are two types of allergic rhinitis:  Seasonal. This type is also called hay fever. It happens only during certain times of the year.  Perennial. This type can happen at any time of the year. What are the causes? This condition may be caused by:  Pollen from grasses, trees, and weeds.  House dust mites.  Pet dander.  Mold. What are the signs or symptoms? Symptoms of this condition include:  Sneezing.  Runny or stuffy nose (nasal congestion).  A lot of mucus in the back of the throat (postnasal drip).  Itchy nose.  Tearing of the eyes.  Trouble sleeping.  Being sleepy during day. How is this treated? There is no cure for this condition. You should avoid things that trigger your symptoms (allergens). Treatment can help to relieve symptoms. This may include:  Medicines that block  allergy symptoms, such as antihistamines. These may be given as a shot, nasal spray, or pill.  Shots that are given until your body becomes less sensitive to the allergen (desensitization).  Stronger medicines, if all other treatments have not worked. Follow these instructions at home: Avoiding allergens   Find out what you are allergic to. Common allergens include smoke, dust, and pollen.  Avoid them if you can. These are some of the things that you can do to avoid allergens: ? Replace carpet with wood, tile, or vinyl flooring. Carpet can trap dander and dust. ? Clean any mold found in the home. ? Do not smoke. Do not allow smoking in your home. ? Change your heating and air conditioning filter at least once a month. ? During allergy season:  Keep windows closed as much as you can. If possible, use air conditioning when there is a lot of pollen in the air.  Use a special filter for allergies with your furnace and air conditioner.  Plan outdoor activities when pollen counts are lowest. This is usually during the early morning or evening hours.  If you do go outdoors when pollen count is high, wear a special mask for people with allergies.  When you come indoors, take a shower and change your clothes before sitting on furniture or bedding. General instructions  Do not use fans in your home.  Do not hang clothes outside to dry.  Wear sunglasses to keep pollen out of your eyes.  Wash your  hands right away after you touch household pets.  Take over-the-counter and prescription medicines only as told by your doctor.  Keep all follow-up visits as told by your doctor. This is important. Contact a doctor if:  You have a fever.  You have a cough that does not go away (is persistent).  You start to make whistling sounds when you breathe (wheeze).  Your symptoms do not get better with treatment.  You have thick fluid coming from your nose.  You start to have nosebleeds. Get  help right away if:  Your tongue or your lips are swollen.  You have trouble breathing.  You feel dizzy or you feel like you are going to pass out (faint).  You have cold sweats. Summary  Allergic rhinitis is a reaction to allergens in the air.  This condition may be caused by allergens. These include pollen, dust mites, pet dander, and mold.  Symptoms include a runny, itchy nose, sneezing, or tearing eyes. You may also have trouble sleeping or feel sleepy during the day.  Treatment includes taking medicines and avoiding allergens. You may also get shots or take stronger medicines.  Get help if you have a fever or a cough that does not stop. Get help right away if you are short of breath. This information is not intended to replace advice given to you by your health care provider. Make sure you discuss any questions you have with your health care provider. Document Released: 02/03/2011 Document Revised: 04/25/2018 Document Reviewed: 04/25/2018 Elsevier Interactive Patient Education  2019 [employer].

## 2018-11-13 NOTE — Progress Notes (Signed)
Subjective: CC: Follow-up bronchitis PCP: Raliegh Ip, DO Mason Walker is a 23 y.o. male presenting to clinic today for:  1.  Follow-up bronchitis Patient was seen on 10/19/2018 for acute bronchitis.  He was prescribed an antibiotic and a prednisone Dosepak.  He notes completion of this medicine.  He reports persistent congestion, ear fullness particularly on the right with decreased hearing.  He reports productive cough.  Denies any hemoptysis, fevers.  He reports substantial mucus production.  He has not used any oral antihistamines "because they do not work".  However, he goes on to state that the medicines he has used Mucinex are not working.  He notes intermittent hearing difficulty prior to onset of URI.   ROS: Per HPI  No Known Allergies Past Medical History:  Diagnosis Date  . Allergy   . Asthma   . HTN (hypertension)   . Sickle cell trait (HCC)   . Sleep apnea    No current outpatient medications on file. Social History   Socioeconomic History  . Marital status: Single    Spouse name: Not on file  . Number of children: Not on file  . Years of education: Not on file  . Highest education level: Not on file  Occupational History  . Occupation: Consulting civil engineer  Social Needs  . Financial resource strain: Not on file  . Food insecurity:    Worry: Not on file    Inability: Not on file  . Transportation needs:    Medical: Not on file    Non-medical: Not on file  Tobacco Use  . Smoking status: Never Smoker  . Smokeless tobacco: Never Used  Substance and Sexual Activity  . Alcohol use: No  . Drug use: No  . Sexual activity: Not on file  Lifestyle  . Physical activity:    Days per week: Not on file    Minutes per session: Not on file  . Stress: Not on file  Relationships  . Social connections:    Talks on phone: Not on file    Gets together: Not on file    Attends religious service: Not on file    Active member of club or organization: Not on file   Attends meetings of clubs or organizations: Not on file    Relationship status: Not on file  . Intimate partner violence:    Fear of current or ex partner: Not on file    Emotionally abused: Not on file    Physically abused: Not on file    Forced sexual activity: Not on file  Other Topics Concern  . Not on file  Social History Narrative  . Not on file   Family History  Problem Relation Age of Onset  . Fibromyalgia Mother   . Diabetes Father   . Hypertension Father   . Hypertension Sister   . Hypertension Maternal Grandmother   . Hypertension Maternal Grandfather   . Alcohol abuse Paternal Grandmother   . Heart disease Paternal Grandfather     Objective: Office vital signs reviewed. BP 125/81   Pulse 78   Temp (!) 97.1 F (36.2 C) (Oral)   Ht 5\' 6"  (1.676 m)   Wt (!) 385 lb (174.6 kg)   SpO2 97%   BMI 62.14 kg/m   Physical Examination:  General: Awake, alert, well nourished, nontoxic. No acute distress HEENT: Normal    Neck: No masses palpated. No lymphadenopathy    Ears: Tympanic membranes intact, normal light reflex, no erythema, no bulging;  scant fluid level noted behind TM.    Eyes:  extraocular membranes intact, sclera white    Nose: nasal turbinates moist, clear nasal discharge    Throat: moist mucus membranes, cobblestone appearance of the oropharynx noted, no tonsillar exudate.  Airway is patent Cardio: regular rate and rhythm, S1S2 heard, no murmurs appreciated Pulm: clear to auscultation bilaterally, no wheezes, rhonchi or rales; normal work of breathing on room air Assessment/ Plan: 23 y.o. male   1. Ear fullness, right I reviewed his note from earlier this month.  Symptoms are likely related to poor eustachian tube drainage in the setting of excess fluid production secondary to recent URI versus allergy mediated.  I have recommended that he start oral antihistamine.  I have prescribed a nasal spray as well.  No evidence of ongoing bacterial infection at  this time.  If symptoms persist, can plan referral to ear nose and throat versus audiology for decreased hearing as below.  2. Decreased hearing of right ear As above  3. Rhinosinusitis Start oral antihistamine, Astelin nasal spray and Tessalon Perles prescribed for ongoing cough.  Reasons for return discussed.  He will follow-up PRN.   No orders of the defined types were placed in this encounter.  Meds ordered this encounter  Medications  . cetirizine (ZYRTEC) 10 MG tablet    Sig: Take 1 tablet (10 mg total) by mouth daily.    Dispense:  30 tablet    Refill:  11  . azelastine (ASTELIN) 0.1 % nasal spray    Sig: Place 2 sprays into both nostrils 2 (two) times daily. Use in each nostril as directed    Dispense:  30 mL    Refill:  12  . benzonatate (TESSALON PERLES) 100 MG capsule    Sig: Take 1 capsule (100 mg total) by mouth 3 (three) times daily as needed.    Dispense:  20 capsule    Refill:  0     Mason Walker Hulen SkainsM Florence Antonelli, DO [facility] [phone]

## 2018-12-13 ENCOUNTER — Other Ambulatory Visit: Payer: Self-pay | Admitting: Family Medicine

## 2018-12-13 ENCOUNTER — Ambulatory Visit: Payer: Self-pay

## 2018-12-13 DIAGNOSIS — M25532 Pain in left wrist: Secondary | ICD-10-CM

## 2019-06-21 ENCOUNTER — Other Ambulatory Visit: Payer: Self-pay | Admitting: Orthopaedic Surgery

## 2019-08-02 ENCOUNTER — Other Ambulatory Visit: Payer: Self-pay

## 2019-08-03 ENCOUNTER — Encounter: Payer: Self-pay | Admitting: Family Medicine

## 2019-08-03 ENCOUNTER — Ambulatory Visit (INDEPENDENT_AMBULATORY_CARE_PROVIDER_SITE_OTHER): Payer: Self-pay | Admitting: Family Medicine

## 2019-08-03 VITALS — BP 131/83 | HR 87 | Temp 98.4°F | Ht 66.0 in | Wt 389.0 lb

## 2019-08-03 DIAGNOSIS — R030 Elevated blood-pressure reading, without diagnosis of hypertension: Secondary | ICD-10-CM

## 2019-08-03 DIAGNOSIS — G4733 Obstructive sleep apnea (adult) (pediatric): Secondary | ICD-10-CM

## 2019-08-03 DIAGNOSIS — Z01818 Encounter for other preprocedural examination: Secondary | ICD-10-CM

## 2019-08-03 DIAGNOSIS — D573 Sickle-cell trait: Secondary | ICD-10-CM

## 2019-08-03 LAB — BAYER DCA HB A1C WAIVED: HB A1C (BAYER DCA - WAIVED): 5.3 % (ref <7.0)

## 2019-08-03 NOTE — Progress Notes (Addendum)
Pt is a 23 y.o. male who is here for preoperative clearance for right shoulder arthroscopy with Dr Rhona Raider.  No chest pain, shortness of breath, change in exercise tolerance.  He does have obstructive sleep apnea and reports compliance with CPAP machine.  He is able to extend his neck without difficulty.  No facial or oral swelling.  No difficulty swallowing.  He is a nonsmoker.  1) High Risk Cardiac Conditions  1) Recent MI - No.  2) Decompensated Heart Failure - No.  3) Unstable angina - No.  4) Symptomatic arrythmia - No.  5) Sx Valvular Disease - No.  2) Intermediate Risk Factors - DM, CKD, CVA, CHF, CAD - No.  2) Functional Status - > 4 mets (Walk, run, climb stairs) Yes.  Rob Hickman Activity Status Index: 50.7  3) Surgery Specific Risk - Intermediate -orthopedic  4) Further Noninvasive evaluation -   1) EKG - Yes.   has severe OSA.   1) NO history of CVA, CAD, DM, CKD  2) Echo - No.   1) Worsening dyspnea   3) Stress Testing - Active Cardiac Disease - No.  5) Need for medical therapy - Beta Blocker, Statins indicated ? No.  PE: Blood pressure 131/83, pulse 87, temperature 98.4 F (36.9 C), temperature source Temporal, height '5\' 6"'  (1.676 m), weight (!) 389 lb (176.4 kg).  Physical Examination: General appearance - alert, well appearing, and in no distress, morbidly obese Mental status - alert, oriented to person, place, and time Mouth - mucous membranes moist, pharynx normal without lesions, tonsils normal and Mallampati 2 Neck - supple, no significant adenopathy, has full active range of motion Chest - clear to auscultation, no wheezes, rales or rhonchi, symmetric air entry Heart - normal rate, regular rhythm, normal S1, S2, no murmurs, rubs, clicks or gallops Extremities - peripheral pulses normal, no pedal edema, no clubbing or cyanosis  1. Preoperative examination I have independently evaluated patient.  Kaydn Kumpf is a 23 y.o. male who pending labs is determined to  be low risk for an intermediate risk surgery.  There are modifiable risk factors (obesity).  Jamarcus Ponds's RCRI calculation for MACE is: 0 (pending lab results).   EKG without evidence of arrhythmia or ischemia.  Will fax over preop risk assessment and results once available to orthopedic office.   - EKG 12-Lead - CMP14+EGFR - CBC - Bayer DCA Hb A1c Waived  2. OSA (obstructive sleep apnea) Currently treated with CPAP machine.  3. Morbid obesity (HCC) BMI greater than 62.79.  Metabolic labs added - EKG 12-Lead - CMP14+EGFR - CBC - Bayer DCA Hb A1c Waived - TSH  4. Sickle cell trait (HCC) Asymptomatic - CBC  5. Elevated blood pressure reading in office without diagnosis of hypertension Likely related to anxiety surrounding appointment and lab draw.  His blood pressures previously have been well controlled.  Manual repeat of blood pressure was 131/83. No treatment needed at this time.  Ashly M. Lajuana Ripple, [facility]

## 2019-08-04 LAB — CMP14+EGFR
ALT: 52 IU/L — ABNORMAL HIGH (ref 0–44)
AST: 31 IU/L (ref 0–40)
Albumin/Globulin Ratio: 1.3 (ref 1.2–2.2)
Albumin: 3.5 g/dL — ABNORMAL LOW (ref 4.1–5.2)
Alkaline Phosphatase: 90 IU/L (ref 39–117)
BUN/Creatinine Ratio: 10 (ref 9–20)
BUN: 9 mg/dL (ref 6–20)
Bilirubin Total: 0.4 mg/dL (ref 0.0–1.2)
CO2: 22 mmol/L (ref 20–29)
Calcium: 9.3 mg/dL (ref 8.7–10.2)
Chloride: 105 mmol/L (ref 96–106)
Creatinine, Ser: 0.92 mg/dL (ref 0.76–1.27)
GFR calc Af Amer: 133 mL/min/{1.73_m2} (ref >59)
GFR calc non Af Amer: 115 mL/min/{1.73_m2} (ref >59)
Globulin, Total: 2.8 g/dL (ref 1.5–4.5)
Glucose: 119 mg/dL — ABNORMAL HIGH (ref 65–99)
Potassium: 4.1 mmol/L (ref 3.5–5.2)
Sodium: 141 mmol/L (ref 134–144)
Total Protein: 6.3 g/dL (ref 6.0–8.5)

## 2019-08-04 LAB — CBC
Hematocrit: 45.1 % (ref 37.5–51.0)
Hemoglobin: 15.3 g/dL (ref 13.0–17.7)
MCH: 28.3 pg (ref 26.6–33.0)
MCHC: 33.9 g/dL (ref 31.5–35.7)
MCV: 84 fL (ref 79–97)
Platelets: 233 10*3/uL (ref 150–450)
RBC: 5.4 x10E6/uL (ref 4.14–5.80)
RDW: 13.1 % (ref 11.6–15.4)
WBC: 6.2 10*3/uL (ref 3.4–10.8)

## 2019-08-04 LAB — TSH: TSH: 1.38 u[IU]/mL (ref 0.450–4.500)

## 2019-08-07 NOTE — Patient Instructions (Addendum)
DUE TO COVID-19 ONLY ONE VISITOR IS ALLOWED TO COME WITH YOU AND STAY IN THE WAITING ROOM ONLY DURING PRE OP AND PROCEDURE DAY OF SURGERY. THE 1 VISITOR MAY VISIT WITH YOU AFTER SURGERY IN YOUR PRIVATE ROOM DURING VISITING HOURS ONLY!  YOU NEED TO HAVE A COVID 19 TEST ON__10/23_____ @_12 :00 pm______, THIS TEST MUST BE DONE BEFORE SURGERY, COME  [address], [place] , [address].  ([place]) ONCE YOUR COVID TEST IS COMPLETED, PLEASE BEGIN THE QUARANTINE INSTRUCTIONS AS OUTLINED IN YOUR HANDOUT.                Mason Walker   Your procedure is scheduled on: 08/14/19   Report to [facility] Main  Entrance   Report to admitting at  12:50 PM     Call this number if you have problems the morning of surgery [place], NO CHEWING GUM [place].   Do not eat food After Midnight.   YOU MAY HAVE CLEAR LIQUIDS FROM MIDNIGHT UNTIL 11:45 AM.  CLEAR LIQUID DIET  Foods Allowed                                                                     Foods Excluded  Water, Black Coffee and tea, regular and decaf                             liquids that you cannot  Plain Jell-O in any flavor  (No red)                                           see through such as: Fruit ices (not with fruit pulp)                                     milk, soups, orange juice  Iced Popsicles (No red)                                    All solid food Carbonated beverages, regular and diet                                    Apple juices Sports drinks like Gatorade (No red) Lightly seasoned clear broth or consume(fat free) Sugar, honey syrup      At 11:45 AM Please finish the prescribed Pre-Surgery Gatorade drink.   Nothing by mouth after you finish the Gatorade drink !    Take these medicines the morning of surgery with A SIP OF WATER: none                                 You may not have any metal on your  body including piercings  Do not wear jewelry,  lotions, powders or  deodorant                        Men may shave face and neck.   Do not bring valuables to the hospital. [facility] IS NOT             RESPONSIBLE   FOR VALUABLES.  Contacts, dentures or bridgework may not be worn into surgery.      Patients discharged the day of surgery will not be allowed to drive home.   IF YOU ARE HAVING SURGERY AND GOING HOME THE SAME DAY, YOU MUST HAVE AN ADULT TO DRIVE YOU HOME AND BE WITH YOU FOR 24 HOURS. YOU MAY GO HOME BY TAXI OR UBER OR ORTHERWISE, BUT AN ADULT MUST ACCOMPANY YOU HOME AND STAY WITH YOU FOR 24 HOURS.  Name and phone number of your driver:  Special Instructions: N/A              Please read over the following fact sheets you were given: _____________________________________________________________________             [facility] - Preparing for Surgery  Before surgery, you can play an important role.   Because skin is not sterile, your skin needs to be as free of germs as possible.   You can reduce the number of germs on your skin by washing with CHG (chlorahexidine gluconate) soap before surgery .  CHG is an antiseptic cleaner which kills germs and bonds with the skin to continue killing germs even after washing. Please DO NOT use if you have an allergy to CHG or antibacterial soaps.   If your skin becomes reddened/irritated stop using the CHG and inform your nurse when you arrive at Short Stay.  You may shave your face/neck. Please follow these instructions carefully:  1.  Shower with CHG Soap the night before surgery and the  morning of Surgery.  2.  If you choose to wash your hair, wash your hair first as usual with your  normal  shampoo.  3.  After you shampoo, rinse your hair and body thoroughly to remove the  shampoo.                                        4.  Use CHG as you would any other liquid soap.  You can apply chg directly  to the skin and  wash                       Gently with a scrungie or clean washcloth.  5.  Apply the CHG Soap to your body ONLY FROM THE NECK DOWN.   Do not use on face/ open                           Wound or open sores. Avoid contact with eyes, ears mouth and genitals (private parts).                       Wash face,  Genitals (private parts) with your normal soap.             6.  Wash thoroughly, paying special attention to the area where your surgery  will be performed.  7.  Thoroughly rinse your body  with warm water from the neck down.  8.  DO NOT shower/wash with your normal soap after using and rinsing off  the CHG Soap.                9.  Pat yourself dry with a clean towel.            10.  Wear clean pajamas.            11.  Place clean sheets on your bed the night of your first shower and do not  sleep with pets. Day of Surgery : Do not apply any lotions/deodorants the morning of surgery.  Please wear clean clothes to the hospital/surgery center.   Incentive Spirometer  An incentive spirometer is a tool that can help keep your lungs clear and active. This tool measures how well you are filling your lungs with each breath. Taking long deep breaths may help reverse or decrease the chance of developing breathing (pulmonary) problems (especially infection) following:  A long period of time when you are unable to move or be active. BEFORE THE PROCEDURE   If the spirometer includes an indicator to show your best effort, your nurse or respiratory therapist will set it to a desired goal.  If possible, sit up straight or lean slightly forward. Try not to slouch.  Hold the incentive spirometer in an upright position. INSTRUCTIONS FOR USE  1. Sit on the edge of your bed if possible, or sit up as far as you can in bed or on a chair. 2. Hold the incentive spirometer in an upright position. 3. Breathe out normally. 4. Place the mouthpiece in your mouth and seal your lips tightly around it. 5. Breathe in  slowly and as deeply as possible, raising the piston or the ball toward the top of the column. 6. Hold your breath for 3-5 seconds or for as long as possible. Allow the piston or ball to fall to the bottom of the column. 7. Remove the mouthpiece from your mouth and breathe out normally. 8. Rest for a few seconds and repeat Steps 1 through 7 at least 10 times every 1-2 hours when you are awake. Take your time and take a few normal breaths between deep breaths. 9. The spirometer may include an indicator to show your best effort. Use the indicator as a goal to work toward during each repetition. 10. After each set of 10 deep breaths, practice coughing to be sure your lungs are clear. If you have an incision (the cut made at the time of surgery), support your incision when coughing by placing a pillow or rolled up towels firmly against it. Once you are able to get out of bed, walk around indoors and cough well. You may stop using the incentive spirometer when instructed by your caregiver.  RISKS AND COMPLICATIONS  Take your time so you do not get dizzy or light-headed.  If you are in pain, you may need to take or ask for pain medication before doing incentive spirometry. It is harder to take a deep breath if you are having pain. AFTER USE  Rest and breathe slowly and easily.  It can be helpful to keep track of a log of your progress. Your caregiver can provide you with a simple table to help with this. If you are using the spirometer at home, follow these instructions: SEEK MEDICAL CARE IF:   You are having difficultly using the spirometer.  You have trouble using the spirometer  as often as instructed.  Your pain medication is not giving enough relief while using the spirometer.  You develop fever of 100.5 F (38.1 C) or higher. SEEK IMMEDIATE MEDICAL CARE IF:   You cough up bloody sputum that had not been present before.  You develop fever of 102 F (38.9 C) or greater.  You develop  worsening pain at or near the incision site. MAKE SURE YOU:   Understand these instructions.  Will watch your condition.  Will get help right away if you are not doing well or get worse. Document Released: 02/14/2007 Document Revised: 12/27/2011 Document Reviewed: 04/17/2007 ExitCare Patient Information 2014 [employer].   ________________________________________________________________________  FAILURE TO FOLLOW THESE INSTRUCTIONS MAY RESULT IN THE CANCELLATION OF YOUR SURGERY PATIENT SIGNATURE_________________________________  NURSE SIGNATURE__________________________________  ________________________________________________________________________

## 2019-08-08 ENCOUNTER — Encounter (HOSPITAL_COMMUNITY): Payer: Self-pay

## 2019-08-08 ENCOUNTER — Encounter (HOSPITAL_COMMUNITY)
Admission: RE | Admit: 2019-08-08 | Discharge: 2019-08-08 | Disposition: A | Discharge status: Home / Self Care | Payer: Worker's Compensation | Source: Ambulatory Visit | Attending: Orthopaedic Surgery | Admitting: Orthopaedic Surgery

## 2019-08-08 ENCOUNTER — Other Ambulatory Visit: Payer: Self-pay

## 2019-08-08 DIAGNOSIS — Z01812 Encounter for preprocedural laboratory examination: Secondary | ICD-10-CM | POA: Diagnosis present

## 2019-08-08 LAB — CBC
HCT: 47.2 % (ref 39.0–52.0)
Hemoglobin: 15.4 g/dL (ref 13.0–17.0)
MCH: 28.3 pg (ref 26.0–34.0)
MCHC: 32.6 g/dL (ref 30.0–36.0)
MCV: 86.6 fL (ref 80.0–100.0)
Platelets: 243 10*3/uL (ref 150–400)
RBC: 5.45 MIL/uL (ref 4.22–5.81)
RDW: 12.9 % (ref 11.5–15.5)
WBC: 8 10*3/uL (ref 4.0–10.5)
nRBC: 0 % (ref 0.0–0.2)

## 2019-08-08 LAB — HEMOGLOBIN A1C
Hgb A1c MFr Bld: 5.4 % (ref 4.8–5.6)
Mean Plasma Glucose: 108.28 mg/dL

## 2019-08-08 NOTE — H&P (Signed)
Mason Walker is an 23 y.o. male.   Chief Complaint: Right shoulder pain HPI: Mason Walker persists with right shoulder pain.  He feels it in the front and on top.  His greatest pain remains when he reaches out and up.  He cannot sleep at night due to discomfort in the shoulder.  He continues in physical therapy at [facility] but is not making much progress.  We have given him some light duty things to do on the job but nothing in those parameters has been found so he consequently is out.  He is not currently on any medications for his shoulder.  He describes his pain as intermittent and moderate to severe.  This is quite limiting to him.  We did inject him once in the past and he says it did not help.   MRI:  I reviewed an MRI scan films and report of a study done at [facility] on 03/22/2019 of the right shoulder demonstrates subacromial and subdeltoid bursitis.  No other acute abnormalities are noted.  Past Medical History:  Diagnosis Date  . Allergy   . Asthma   . HTN (hypertension)   . Sickle cell trait (HCC)   . Sleep apnea     Past Surgical History:  Procedure Laterality Date  . FRACTURE SURGERY    . TONSILLECTOMY AND ADENOIDECTOMY      Family History  Problem Relation Age of Onset  . Fibromyalgia Mother   . Diabetes Father   . Hypertension Father   . Hypertension Sister   . Hypertension Maternal Grandmother   . Hypertension Maternal Grandfather   . Alcohol abuse Paternal Grandmother   . Heart disease Paternal Grandfather    Social History:  reports that he has never smoked. He has never used smokeless tobacco. He reports that he does not drink alcohol or use drugs.  Allergies: No Known Allergies  No medications prior to admission.    No results found for this or any previous visit (from the past 48 hour(s)). No results found.  Review of Systems  Musculoskeletal: Positive for joint pain.       Right shoulder pain  All other systems reviewed and are  negative.   There were no vitals taken for this visit. Physical Exam  Constitutional: He is oriented to person, place, and time. He appears well-developed and well-nourished.  HENT:  Head: Normocephalic and atraumatic.  Eyes: Pupils are equal, round, and reactive to light.  Neck: Normal range of motion.  Cardiovascular: Normal rate and regular rhythm.  Respiratory: Effort normal.  GI: Soft.  Musculoskeletal:     Comments: Cervical motion is fairly good today.  I do not get much pain about his neck.  He has impingement of the right shoulder especially in secondary position.  He also has some pain over the [facility] joint and pain to cross chest maneuvers.  His motion is nearly full.  He still persists with a painful arc bringing bring his arm up and down.  Cuff strength seems normal but it is very painful in resisted forward flexion.    Neurological: He is alert and oriented to person, place, and time.  Skin: Skin is warm and dry.  Psychiatric: He has a normal mood and affect. His behavior is normal. Judgment and thought content normal.     Assessment/Plan Assessment: Right shoulder impingement and AC pain with MRI 2020 injected 05/11/19  Plan: Tong continues with significant pain now 6 months from his injury.  He has difficulty  resting at night and using his arm.  He remains out of work due to this injury.  He has been through months of physical therapy, injection, and medication.  Looking at his MRI scan I think he does have a partial thickness bursal aspect tear.  I think we can help him with an arthroscopy.  I reviewed risk of anesthesia, infection, DVT.  Plan will be to perform an acromioplasty, AC resection, and debridement through the scope.    Mason Sachs Aly Hauser, PA-C 08/08/2019, 11:52 AM

## 2019-08-10 ENCOUNTER — Other Ambulatory Visit (HOSPITAL_COMMUNITY)
Admission: RE | Admit: 2019-08-10 | Discharge: 2019-08-10 | Disposition: A | Discharge status: Home / Self Care | Payer: Worker's Compensation | Source: Ambulatory Visit | Attending: Orthopaedic Surgery | Admitting: Orthopaedic Surgery

## 2019-08-10 DIAGNOSIS — Z20828 Contact with and (suspected) exposure to other viral communicable diseases: Secondary | ICD-10-CM | POA: Diagnosis not present

## 2019-08-10 DIAGNOSIS — Z01812 Encounter for preprocedural laboratory examination: Secondary | ICD-10-CM | POA: Diagnosis present

## 2019-08-10 NOTE — Progress Notes (Signed)
PCP - Ronnie Doss DO Cardiologist - none  Chest x-ray - 12/13/18 EKG - 08/03/19 Stress Test - no ECHO - no Cardiac Cath - no  Sleep Study - yes CPAP - Pt uses c-pap every night  Fasting Blood Sugar - NA Checks Blood Sugar _____ times a day  Blood Thinner Instructions:NA Aspirin Instructions: Last Dose:  Anesthesia review:   Patient denies shortness of breath, fever, cough and chest pain at PAT appointment yes  Patient verbalized understanding of instructions that were given to them at the PAT appointment. Patient was also instructed that they will need to review over the PAT instructions again at home before surgery. Yes Pt's BMI 62.46. neck circumference is over 18 in. He has full ROM and is able to open his mouth wide and easily. His asthma is well controlled at this time . He uses his C-Pap every night and feels that he gets good rest. He has never had problems with anesthesia in the past.

## 2019-08-11 LAB — NOVEL CORONAVIRUS, NAA (HOSP ORDER, SEND-OUT TO REF LAB; TAT 18-24 HRS): SARS-CoV-2, NAA: NOT DETECTED

## 2019-08-13 MED ORDER — DEXTROSE 5 % IV SOLN
3.0000 g | INTRAVENOUS | Status: AC
  Administered 2019-08-14: 3 g via INTRAVENOUS
  Filled 2019-08-13: qty 3

## 2019-08-14 ENCOUNTER — Encounter (HOSPITAL_COMMUNITY): Payer: Self-pay | Admitting: Emergency Medicine

## 2019-08-14 ENCOUNTER — Ambulatory Visit (HOSPITAL_COMMUNITY): Payer: Worker's Compensation

## 2019-08-14 ENCOUNTER — Other Ambulatory Visit: Payer: Self-pay

## 2019-08-14 ENCOUNTER — Encounter (HOSPITAL_COMMUNITY)
Admission: RE | Disposition: A | Discharge status: Home / Self Care | Payer: Self-pay | Source: Home / Self Care | Attending: Orthopaedic Surgery

## 2019-08-14 ENCOUNTER — Ambulatory Visit (HOSPITAL_COMMUNITY): Payer: Worker's Compensation | Admitting: Anesthesiology

## 2019-08-14 ENCOUNTER — Observation Stay (HOSPITAL_COMMUNITY)
Admission: RE | Admit: 2019-08-14 | Discharge: 2019-08-15 | Disposition: A | Discharge status: Home / Self Care | Payer: Worker's Compensation | Attending: Orthopaedic Surgery | Admitting: Orthopaedic Surgery

## 2019-08-14 DIAGNOSIS — G4730 Sleep apnea, unspecified: Secondary | ICD-10-CM | POA: Insufficient documentation

## 2019-08-14 DIAGNOSIS — M75111 Incomplete rotator cuff tear or rupture of right shoulder, not specified as traumatic: Secondary | ICD-10-CM | POA: Diagnosis not present

## 2019-08-14 DIAGNOSIS — D573 Sickle-cell trait: Secondary | ICD-10-CM | POA: Diagnosis not present

## 2019-08-14 DIAGNOSIS — M25511 Pain in right shoulder: Secondary | ICD-10-CM | POA: Diagnosis present

## 2019-08-14 DIAGNOSIS — I10 Essential (primary) hypertension: Secondary | ICD-10-CM | POA: Diagnosis not present

## 2019-08-14 DIAGNOSIS — J45909 Unspecified asthma, uncomplicated: Secondary | ICD-10-CM | POA: Insufficient documentation

## 2019-08-14 DIAGNOSIS — Z6841 Body Mass Index (BMI) 40.0 and over, adult: Secondary | ICD-10-CM | POA: Insufficient documentation

## 2019-08-14 DIAGNOSIS — Z8249 Family history of ischemic heart disease and other diseases of the circulatory system: Secondary | ICD-10-CM | POA: Insufficient documentation

## 2019-08-14 HISTORY — PX: SHOULDER ARTHROSCOPY: SHX128

## 2019-08-14 LAB — GLUCOSE, CAPILLARY: Glucose-Capillary: 102 mg/dL — ABNORMAL HIGH (ref 70–99)

## 2019-08-14 SURGERY — ARTHROSCOPY, SHOULDER
Anesthesia: General | Site: Shoulder | Laterality: Right

## 2019-08-14 MED ORDER — ACETAMINOPHEN 500 MG PO TABS
1000.0000 mg | ORAL_TABLET | Freq: Once | ORAL | Status: AC
  Administered 2019-08-14: 1000 mg via ORAL
  Filled 2019-08-14: qty 2

## 2019-08-14 MED ORDER — FENTANYL CITRATE (PF) 100 MCG/2ML IJ SOLN
INTRAMUSCULAR | Status: DC | PRN
  Administered 2019-08-14: 50 ug via INTRAVENOUS
  Administered 2019-08-14: 100 ug via INTRAVENOUS

## 2019-08-14 MED ORDER — PHENYLEPHRINE HCL-NACL 10-0.9 MG/250ML-% IV SOLN
INTRAVENOUS | Status: DC | PRN
  Administered 2019-08-14: 26.6 ug/min via INTRAVENOUS

## 2019-08-14 MED ORDER — MIDAZOLAM HCL 2 MG/2ML IJ SOLN
1.0000 mg | INTRAMUSCULAR | Status: DC
  Administered 2019-08-14: 2 mg via INTRAVENOUS
  Administered 2019-08-14: 1 mg via INTRAVENOUS
  Filled 2019-08-14: qty 2

## 2019-08-14 MED ORDER — DOCUSATE SODIUM 100 MG PO CAPS
100.0000 mg | ORAL_CAPSULE | Freq: Two times a day (BID) | ORAL | Status: DC
  Administered 2019-08-14: 22:00:00 100 mg via ORAL
  Filled 2019-08-14: qty 1

## 2019-08-14 MED ORDER — LIDOCAINE 2% (20 MG/ML) 5 ML SYRINGE
INTRAMUSCULAR | Status: DC | PRN
  Administered 2019-08-14: 50 mg via INTRAVENOUS

## 2019-08-14 MED ORDER — ACETAMINOPHEN 325 MG PO TABS: 325.0000 mg | ORAL_TABLET | Freq: Four times a day (QID) | ORAL | Status: DC | PRN

## 2019-08-14 MED ORDER — OXYCODONE HCL 5 MG PO TABS: 5.0000 mg | ORAL_TABLET | Freq: Once | ORAL | Status: DC | PRN

## 2019-08-14 MED ORDER — DEXAMETHASONE SODIUM PHOSPHATE 10 MG/ML IJ SOLN
INTRAMUSCULAR | Status: DC | PRN
  Administered 2019-08-14: 10 mg via INTRAVENOUS

## 2019-08-14 MED ORDER — LIDOCAINE 2% (20 MG/ML) 5 ML SYRINGE
INTRAMUSCULAR | Status: AC
  Filled 2019-08-14: qty 5

## 2019-08-14 MED ORDER — KETOROLAC TROMETHAMINE 15 MG/ML IJ SOLN
15.0000 mg | Freq: Four times a day (QID) | INTRAMUSCULAR | Status: DC
  Administered 2019-08-14 – 2019-08-15 (×2): 15 mg via INTRAVENOUS
  Filled 2019-08-14 (×3): qty 1

## 2019-08-14 MED ORDER — CHLORHEXIDINE GLUCONATE 4 % EX LIQD: 60.0000 mL | Freq: Once | CUTANEOUS | Status: DC

## 2019-08-14 MED ORDER — ACETAMINOPHEN 500 MG PO TABS
500.0000 mg | ORAL_TABLET | Freq: Four times a day (QID) | ORAL | Status: DC
  Administered 2019-08-14 – 2019-08-15 (×2): 500 mg via ORAL
  Filled 2019-08-14 (×3): qty 1

## 2019-08-14 MED ORDER — ONDANSETRON HCL 4 MG/2ML IJ SOLN
INTRAMUSCULAR | Status: AC
  Filled 2019-08-14: qty 2

## 2019-08-14 MED ORDER — SUGAMMADEX SODIUM 500 MG/5ML IV SOLN: INTRAVENOUS | Status: DC | PRN

## 2019-08-14 MED ORDER — MEPERIDINE HCL 50 MG/ML IJ SOLN: 6.2500 mg | INTRAMUSCULAR | Status: DC | PRN

## 2019-08-14 MED ORDER — MIDAZOLAM HCL 5 MG/5ML IJ SOLN
INTRAMUSCULAR | Status: DC | PRN
  Administered 2019-08-14: 1 mg via INTRAVENOUS

## 2019-08-14 MED ORDER — HYDROMORPHONE HCL 1 MG/ML IJ SOLN: 0.2500 mg | INTRAMUSCULAR | Status: DC | PRN

## 2019-08-14 MED ORDER — BUPIVACAINE HCL (PF) 0.5 % IJ SOLN
INTRAMUSCULAR | Status: AC
  Filled 2019-08-14: qty 30

## 2019-08-14 MED ORDER — HYDROCODONE-ACETAMINOPHEN 5-325 MG PO TABS: 1.0000 | ORAL_TABLET | ORAL | Status: DC | PRN

## 2019-08-14 MED ORDER — ROCURONIUM BROMIDE 10 MG/ML (PF) SYRINGE
PREFILLED_SYRINGE | INTRAVENOUS | Status: DC | PRN
  Administered 2019-08-14: 50 mg via INTRAVENOUS

## 2019-08-14 MED ORDER — PROMETHAZINE HCL 25 MG/ML IJ SOLN: 6.2500 mg | INTRAMUSCULAR | Status: DC | PRN

## 2019-08-14 MED ORDER — SUCCINYLCHOLINE CHLORIDE 200 MG/10ML IV SOSY
PREFILLED_SYRINGE | INTRAVENOUS | Status: AC
  Filled 2019-08-14: qty 10

## 2019-08-14 MED ORDER — FENTANYL CITRATE (PF) 100 MCG/2ML IJ SOLN
50.0000 ug | INTRAMUSCULAR | Status: DC
  Administered 2019-08-14 (×2): 50 ug via INTRAVENOUS
  Filled 2019-08-14: qty 2

## 2019-08-14 MED ORDER — MORPHINE SULFATE (PF) 2 MG/ML IV SOLN: 0.5000 mg | INTRAVENOUS | Status: DC | PRN

## 2019-08-14 MED ORDER — POVIDONE-IODINE 10 % EX SWAB
2.0000 "application " | Freq: Once | CUTANEOUS | Status: AC
  Administered 2019-08-14: 2 via TOPICAL

## 2019-08-14 MED ORDER — ROCURONIUM BROMIDE 10 MG/ML (PF) SYRINGE
PREFILLED_SYRINGE | INTRAVENOUS | Status: AC
  Filled 2019-08-14: qty 10

## 2019-08-14 MED ORDER — ONDANSETRON HCL 4 MG PO TABS: 4.0000 mg | ORAL_TABLET | Freq: Four times a day (QID) | ORAL | Status: DC | PRN

## 2019-08-14 MED ORDER — ONDANSETRON HCL 4 MG/2ML IJ SOLN
INTRAMUSCULAR | Status: DC | PRN
  Administered 2019-08-14: 4 mg via INTRAVENOUS

## 2019-08-14 MED ORDER — PROPOFOL 10 MG/ML IV BOLUS
INTRAVENOUS | Status: DC | PRN
  Administered 2019-08-14: 360 mg via INTRAVENOUS

## 2019-08-14 MED ORDER — OXYCODONE HCL 5 MG/5ML PO SOLN: 5.0000 mg | Freq: Once | ORAL | Status: DC | PRN

## 2019-08-14 MED ORDER — DEXAMETHASONE SODIUM PHOSPHATE 10 MG/ML IJ SOLN
INTRAMUSCULAR | Status: AC
  Filled 2019-08-14: qty 1

## 2019-08-14 MED ORDER — SUGAMMADEX SODIUM 500 MG/5ML IV SOLN
INTRAVENOUS | Status: DC | PRN
  Administered 2019-08-14: 400 mg via INTRAVENOUS

## 2019-08-14 MED ORDER — PHENYLEPHRINE HCL (PRESSORS) 10 MG/ML IV SOLN
INTRAVENOUS | Status: AC
  Filled 2019-08-14: qty 1

## 2019-08-14 MED ORDER — FENTANYL CITRATE (PF) 250 MCG/5ML IJ SOLN
INTRAMUSCULAR | Status: AC
  Filled 2019-08-14: qty 5

## 2019-08-14 MED ORDER — LACTATED RINGERS IV SOLN
INTRAVENOUS | Status: DC
  Administered 2019-08-14 (×2): via INTRAVENOUS

## 2019-08-14 MED ORDER — ROPIVACAINE HCL 7.5 MG/ML IJ SOLN
INTRAMUSCULAR | Status: DC | PRN
  Administered 2019-08-14 (×4): 5 mL via PERINEURAL

## 2019-08-14 MED ORDER — METHOCARBAMOL 500 MG PO TABS: 500.0000 mg | ORAL_TABLET | Freq: Four times a day (QID) | ORAL | Status: DC | PRN

## 2019-08-14 MED ORDER — MIDAZOLAM HCL 2 MG/2ML IJ SOLN
INTRAMUSCULAR | Status: AC
  Filled 2019-08-14: qty 2

## 2019-08-14 MED ORDER — ONDANSETRON HCL 4 MG/2ML IJ SOLN: 4.0000 mg | Freq: Four times a day (QID) | INTRAMUSCULAR | Status: DC | PRN

## 2019-08-14 MED ORDER — DIPHENHYDRAMINE HCL 12.5 MG/5ML PO ELIX: 12.5000 mg | ORAL_SOLUTION | ORAL | Status: DC | PRN

## 2019-08-14 MED ORDER — LACTATED RINGERS IR SOLN
Status: DC | PRN
  Administered 2019-08-14: 6000 mL

## 2019-08-14 MED ORDER — SUCCINYLCHOLINE CHLORIDE 200 MG/10ML IV SOSY
PREFILLED_SYRINGE | INTRAVENOUS | Status: DC | PRN
  Administered 2019-08-14: 160 mg via INTRAVENOUS

## 2019-08-14 MED ORDER — EPINEPHRINE PF 1 MG/ML IJ SOLN
INTRAMUSCULAR | Status: AC
  Filled 2019-08-14: qty 1

## 2019-08-14 MED ORDER — METOCLOPRAMIDE HCL 5 MG/ML IJ SOLN: 5.0000 mg | Freq: Three times a day (TID) | INTRAMUSCULAR | Status: DC | PRN

## 2019-08-14 MED ORDER — LACTATED RINGERS IV SOLN
INTRAVENOUS | Status: DC
  Administered 2019-08-14: 19:00:00 via INTRAVENOUS

## 2019-08-14 MED ORDER — CLONIDINE HCL (ANALGESIA) 100 MCG/ML EP SOLN
EPIDURAL | Status: DC | PRN
  Administered 2019-08-14: 100 ug

## 2019-08-14 MED ORDER — KETOROLAC TROMETHAMINE 30 MG/ML IJ SOLN: 30.0000 mg | Freq: Once | INTRAMUSCULAR | Status: DC | PRN

## 2019-08-14 MED ORDER — METHOCARBAMOL 1000 MG/10ML IJ SOLN
500.0000 mg | Freq: Four times a day (QID) | INTRAVENOUS | Status: DC | PRN
  Filled 2019-08-14: qty 5

## 2019-08-14 MED ORDER — 0.9 % SODIUM CHLORIDE (POUR BTL) OPTIME
TOPICAL | Status: DC | PRN
  Administered 2019-08-14: 1000 mL

## 2019-08-14 MED ORDER — METOCLOPRAMIDE HCL 5 MG PO TABS: 5.0000 mg | ORAL_TABLET | Freq: Three times a day (TID) | ORAL | Status: DC | PRN

## 2019-08-14 SURGICAL SUPPLY — 41 items
BLADE GREAT WHITE 4.2 (BLADE) ×2 IMPLANT
BLADE GREAT WHITE 4.2MM (BLADE) ×1
BLADE SURG SZ11 CARB STEEL (BLADE) ×3 IMPLANT
BUR OVAL 4.0 (BURR) ×3 IMPLANT
BUR VERTEX HOODED 4.5 (BURR) ×3 IMPLANT
CANNULA SHOULDER 7CM (CANNULA) ×3 IMPLANT
CANNULA TWIST IN 8.25X7CM (CANNULA) ×3 IMPLANT
COVER SURGICAL LIGHT HANDLE (MISCELLANEOUS) ×3 IMPLANT
COVER WAND RF STERILE (DRAPES) IMPLANT
DRAPE SHEET LG 3/4 BI-LAMINATE (DRAPES) ×6 IMPLANT
DRAPE SHOULDER BEACH CHAIR (DRAPES) ×3 IMPLANT
DRAPE SURG 17X11 SM STRL (DRAPES) ×3 IMPLANT
DRAPE U-SHAPE 47X51 STRL (DRAPES) ×3 IMPLANT
DRSG ADAPTIC 3X8 NADH LF (GAUZE/BANDAGES/DRESSINGS) ×3 IMPLANT
DRSG EMULSION OIL 3X3 NADH (GAUZE/BANDAGES/DRESSINGS) ×3 IMPLANT
DRSG PAD ABDOMINAL 8X10 ST (GAUZE/BANDAGES/DRESSINGS) ×7 IMPLANT
DURAPREP 26ML APPLICATOR (WOUND CARE) ×3 IMPLANT
GAUZE SPONGE 4X4 12PLY STRL (GAUZE/BANDAGES/DRESSINGS) ×3 IMPLANT
GLOVE BIO SURGEON STRL SZ8.5 (GLOVE) ×3 IMPLANT
KIT BASIN OR (CUSTOM PROCEDURE TRAY) ×3 IMPLANT
KIT POSITION SHOULDER SCHLEI (MISCELLANEOUS) ×3 IMPLANT
KIT TURNOVER KIT A (KITS) IMPLANT
MANIFOLD NEPTUNE II (INSTRUMENTS) ×3 IMPLANT
NDL SAFETY ECLIPSE 18X1.5 (NEEDLE) ×1 IMPLANT
NDL SPNL 18GX3.5 QUINCKE PK (NEEDLE) ×1 IMPLANT
NEEDLE HYPO 18GX1.5 SHARP (NEEDLE) ×2
NEEDLE SPNL 18GX3.5 QUINCKE PK (NEEDLE) ×3 IMPLANT
PACK SHOULDER (CUSTOM PROCEDURE TRAY) ×3 IMPLANT
PROBE BIPOLAR ATHRO 135MM 90D (MISCELLANEOUS) ×3 IMPLANT
PROTECTOR NERVE ULNAR (MISCELLANEOUS) ×3 IMPLANT
RESTRAINT HEAD UNIVERSAL NS (MISCELLANEOUS) ×3 IMPLANT
SLING ARM IMMOBILIZER LRG (SOFTGOODS) IMPLANT
SLING ARM IMMOBILIZER MED (SOFTGOODS) ×3 IMPLANT
SLING ARM IMMOBILIZER XL (CAST SUPPLIES) ×2 IMPLANT
SUT ETHILON 4 0 PS 2 18 (SUTURE) ×3 IMPLANT
SYR 3ML LL SCALE MARK (SYRINGE) ×3 IMPLANT
SYR CONTROL 10ML LL (SYRINGE) ×3 IMPLANT
TUBING ARTHRO INFLOW-ONLY STRL (TUBING) IMPLANT
TUBING ARTHROSCOPY IRRIG 16FT (MISCELLANEOUS) ×3 IMPLANT
TUBING CONNECTING 10 (TUBING) ×2 IMPLANT
TUBING CONNECTING 10' (TUBING) ×1

## 2019-08-14 NOTE — Anesthesia Procedure Notes (Signed)
Procedure Name: Intubation Date/Time: 08/14/2019 4:30 PM Performed by: Lissa Morales, CRNA Pre-anesthesia Checklist: Patient identified, Emergency Drugs available, Suction available and Patient being monitored Patient Re-evaluated:Patient Re-evaluated prior to induction Oxygen Delivery Method: Circle system utilized Preoxygenation: Pre-oxygenation with 100% oxygen Induction Type: IV induction, Rapid sequence and Cricoid Pressure applied Laryngoscope Size: Glidescope and 4 Tube type: Oral Tube size: 7.5 mm Number of attempts: 1 Airway Equipment and Method: Stylet and Oral airway Placement Confirmation: ETT inserted through vocal cords under direct vision,  positive ETCO2 and breath sounds checked- equal and bilateral Secured at: 22 cm Tube secured with: Tape Dental Injury: Teeth and Oropharynx as per pre-operative assessment  Difficulty Due To: Difficulty was anticipated, Difficult Airway- due to large tongue, Difficult Airway- due to anterior larynx and Difficult Airway- due to limited oral opening

## 2019-08-14 NOTE — Interval H&P Note (Signed)
History and Physical Interval Note:  08/14/2019 3:01 PM  Mason Walker  has presented today for surgery, with the diagnosis of RIGHT SHOULDER IMPINGMENT AND ACROMIOCLAVICULAR PAIN.  The various methods of treatment have been discussed with the patient and family. After consideration of risks, benefits and other options for treatment, the patient has consented to  Procedure(s): RIGHT ARTHROSCOPY SHOULDER (Right) as a surgical intervention.  The patient's history has been reviewed, patient examined, no change in status, stable for surgery.  I have reviewed the patient's chart and labs.  Questions were answered to the patient's satisfaction.     Hessie Dibble

## 2019-08-14 NOTE — Progress Notes (Signed)
Pt home CPAP machine inspected for damages/defects, none were found.  Pt to self administer CPAP when ready for bed.  RT to monitor and assess as needed.  

## 2019-08-14 NOTE — Anesthesia Preprocedure Evaluation (Addendum)
Anesthesia Evaluation  Patient identified by MRN, date of birth, ID band Patient awake    Reviewed: Allergy & Precautions, NPO status , Patient's Chart, lab work & pertinent test results  Airway Mallampati: II  TM Distance: >3 FB Neck ROM: Full    Dental no notable dental hx. (+) Teeth Intact   Pulmonary asthma , sleep apnea ,    Pulmonary exam normal breath sounds clear to auscultation       Cardiovascular hypertension, negative cardio ROS Normal cardiovascular exam Rhythm:Regular Rate:Normal  HTN no meds   Neuro/Psych PSYCHIATRIC DISORDERS negative neurological ROS     GI/Hepatic negative GI ROS, Neg liver ROS,   Endo/Other  Morbid obesityBMI 63  Renal/GU negative Renal ROS  negative genitourinary   Musculoskeletal negative musculoskeletal ROS (+)   Abdominal (+) + obese,   Peds negative pediatric ROS (+)  Hematology negative hematology ROS (+)   Anesthesia Other Findings   Reproductive/Obstetrics negative OB ROS                            Anesthesia Physical Anesthesia Plan  ASA: III  Anesthesia Plan: General   Post-op Pain Management:  Regional for Post-op pain and GA combined w/ Regional for post-op pain   Induction: Intravenous  PONV Risk Score and Plan: 2 and Ondansetron, Dexamethasone, Midazolam and Treatment may vary due to age or medical condition  Airway Management Planned: Oral ETT and Video Laryngoscope Planned  Additional Equipment: None  Intra-op Plan:   Post-operative Plan: Extubation in OR  Informed Consent: I have reviewed the patients History and Physical, chart, labs and discussed the procedure including the risks, benefits and alternatives for the proposed anesthesia with the patient or authorized representative who has indicated his/her understanding and acceptance.     Dental advisory given  Plan Discussed with: CRNA  Anesthesia Plan Comments:        Anesthesia Quick Evaluation

## 2019-08-14 NOTE — Op Note (Signed)
Mason Walker, TUBBS MEDICAL RECORD [id number] ACCOUNT [id number] DATE OF BIRTH:Aug 30, 1996 FACILITY: [facility] LOCATION: [place], [place]  OPERATIVE REPORT  DATE OF PROCEDURE:  08/14/2019  PREOPERATIVE DIAGNOSES: 1.  Right shoulder partial rotator cuff tear. 2.  Right shoulder acromioclavicular pain.  POSTOPERATIVE DIAGNOSES:   1.  Right shoulder partial rotator cuff tear. 2.  Right shoulder acromioclavicular pain.  PROCEDURE: 1.  Right shoulder arthroscopic acromioplasty. 2.  Right shoulder arthroscopic debridement. 3.  Right shoulder arthroscopic AC resection.  ANESTHESIA:  General and block.  ATTENDING SURGEON:  Melrose Nakayama, MD  ASSISTANT:  Loni Dolly, PA.  INDICATIONS:  The patient is a 23 year old man who is about eight or nine months out from a work-related injury to his shoulder.  He has persisted with difficulty despite injections and supervised physical therapy and activity restriction.  By MRI scan,  he has a partial rotator cuff tear.  He has difficulty resting at night and difficulty using his arm and doing his job.  He is offered an arthroscopy.  Informed operative consent was obtained after discussion of possible complications including reaction  to anesthesia and infection.  SUMMARY OF FINDINGS AND PROCEDURE:  Under general anesthesia and a block, a right shoulder arthroscopy was performed.  The glenohumeral joint showed no degenerative change in the biceps tendon.  Rotator cuff appeared benign from below.  In the  subacromial space, he did have a partial thickness rotator cuff tear on the bursal aspect.  A debridement was done.  This was not significant enough to require a formal repair.  He had a mildly prominent subacromial morphology dressed with a very  conservative acromioplasty.  He did have some degenerative change at the [facility] joint and a formal AC resection was done.  He was scheduled to be admitted for overnight observation  through anesthesia for his sleep apnea issue.  DESCRIPTION OF PROCEDURE:  The patient was taken to the operating suite where general anesthetic was applied without difficulty.  He was also given a block in the preanesthesia area.  He was positioned in a beach chair position with some difficulty due  to his size, but eventually we got him position well and we padded all bony prominences appropriately.  He was then prepped and draped in normal sterile fashion.  After the administration of preoperative IV Kefzol and appropriate time-out, and  arthroscopy of the right shoulder was performed through a total of 3 portals.  Findings were as noted above and procedure consisted most significantly of the debridement of the bursal aspect of the cuff for his partial thickness tear.  We then performed  an acromioplasty with the bur in the lateral position followed by transfer of bur to the posterior position.  I also performed a formal distal claviculectomy for an [facility] resection through the anterior portal using an arthroscopic bur there.  We created a  centimeter space between the distal clavicle and acromion.  At this point, the shoulder was thoroughly irrigated followed by reapproximation of the portals loosely with nylon.  Adaptic was applied followed by dry gauze and tape.  ESTIMATED FLUIDS:  Obtained from anesthesia records.  DISPOSITION:  The patient was extubated in the operating room, taken to recovery room in stable condition.  Plans were for him to stay overnight with probable discharge home in the morning.  TN/NUANCE  D:08/14/2019 T:08/14/2019 JOB:[id number]

## 2019-08-14 NOTE — Transfer of Care (Signed)
Immediate Anesthesia Transfer of Care Note  Patient: Mason Walker  Procedure(s) Performed: RIGHT ACROMIOPLASTY, DISTAL CLAVU;ECTOMY AND DEBRIDEMENT (Right Shoulder)  Patient Location: PACU  Anesthesia Type:General  Level of Consciousness: awake, alert , oriented and patient cooperative  Airway & Oxygen Therapy: Patient Spontanous Breathing and Patient connected to face mask oxygen  Post-op Assessment: Report given to RN, Post -op Vital signs reviewed and stable and Patient moving all extremities X 4  Post vital signs: stable  Last Vitals:  Vitals Value Taken Time  BP 143/82 08/14/19 1743  Temp 37 C 08/14/19 1743  Pulse 98 08/14/19 1746  Resp 24 08/14/19 1746  SpO2 100 % 08/14/19 1746  Vitals shown include unvalidated device data.  Last Pain:  Vitals:   08/14/19 1330  TempSrc:   PainSc: 2       Patients Stated Pain Goal: 4 (82/99/37 1696)  Complications: No apparent anesthesia complications

## 2019-08-14 NOTE — Progress Notes (Signed)
Assisted Dr. Hatchett with right, ultrasound guided, interscalene  block. Side rails up, monitors on throughout procedure. See vital signs in flow sheet. Tolerated Procedure well.  

## 2019-08-14 NOTE — Anesthesia Procedure Notes (Signed)
Anesthesia Regional Block: Interscalene brachial plexus block   Pre-Anesthetic Checklist: ,, timeout performed, Correct Patient, Correct Site, Correct Laterality, Correct Procedure, Correct Position, site marked, Risks and benefits discussed,  Surgical consent,  Pre-op evaluation,  At surgeon's request and post-op pain management  Laterality: Right and Upper  Prep: chloraprep       Needles:  Injection technique: Single-shot  Needle Type: Echogenic Stimulator Needle     Needle Length: 10cm  Needle Gauge: 21   Needle insertion depth: 3 cm   Additional Needles:   Procedures:,,,, ultrasound used (permanent image in chart),,,,  Narrative:  Start time: 08/14/2019 3:05 PM End time: 08/14/2019 3:15 PM Injection made incrementally with aspirations every 5 mL.  Performed by: Personally  Anesthesiologist: Lyn Hollingshead, MD

## 2019-08-14 NOTE — Anesthesia Postprocedure Evaluation (Signed)
Anesthesia Post Note  Patient: Mason Walker  Procedure(s) Performed: RIGHT ACROMIOPLASTY, DISTAL CLAVU;ECTOMY AND DEBRIDEMENT (Right Shoulder)     Patient location during evaluation: PACU Anesthesia Type: General Level of consciousness: awake and alert Pain management: pain level controlled Vital Signs Assessment: post-procedure vital signs reviewed and stable Respiratory status: spontaneous breathing, nonlabored ventilation and respiratory function stable Cardiovascular status: blood pressure returned to baseline and stable Postop Assessment: no apparent nausea or vomiting Anesthetic complications: no    Last Vitals:  Vitals:   08/14/19 1800 08/14/19 1815  BP: (!) 135/57 (!) 123/58  Pulse: (!) 102 95  Resp: (!) 27 (!) 27  Temp:  36.6 C  SpO2: 94% 93%    Last Pain:  Vitals:   08/14/19 1815  TempSrc:   PainSc: 0-No pain                 Audry Pili

## 2019-08-14 NOTE — Op Note (Signed)
Mason Walker [id number] 08/14/2019   PRE-OP DIAGNOSIS: right sh partial rotator cuff tear and AC pain  POST-OP DIAGNOSIS: same  PROCEDURE: right sh scope  ANESTHESIA: general and block  Hessie Dibble   Dictation #:  [id number]

## 2019-08-15 ENCOUNTER — Encounter (HOSPITAL_COMMUNITY): Payer: Self-pay | Admitting: Orthopaedic Surgery

## 2019-08-15 DIAGNOSIS — M75111 Incomplete rotator cuff tear or rupture of right shoulder, not specified as traumatic: Secondary | ICD-10-CM | POA: Diagnosis not present

## 2019-08-15 MED ORDER — HYDROCODONE-ACETAMINOPHEN 5-325 MG PO TABS: 1.0000 | ORAL_TABLET | Freq: Four times a day (QID) | ORAL | 0 refills | Status: DC | PRN

## 2019-08-15 NOTE — Discharge Summary (Signed)
Patient ID: Mason Walker MRN: [id number] DOB/AGE: 1995/11/22 23 y.o.  Admit date: 08/14/2019 Discharge date: 08/15/2019  Admission Diagnoses:  Principal Problem:   Right shoulder pain   Discharge Diagnoses:  Same  Past Medical History:  Diagnosis Date  . Allergy   . Asthma   . HTN (hypertension)    no meds at this time  . Pneumonia 10/2018  . Sickle cell trait (HCC)   . Sleep apnea    c-pap    Surgeries: Procedure(s): RIGHT ACROMIOPLASTY, DISTAL CLAVU;ECTOMY AND DEBRIDEMENT on 08/14/2019   Consultants:   Discharged Condition: Improved  Hospital Course: Mason Walker is an 23 y.o. male who was admitted 08/14/2019 for operative treatment ofRight shoulder pain. Patient has severe unremitting pain that affects sleep, daily activities, and work/hobbies. After pre-op clearance the patient was taken to the operating room on 08/14/2019 and underwent  Procedure(s): RIGHT ACROMIOPLASTY, DISTAL CLAVU;ECTOMY AND DEBRIDEMENT.    Patient was given perioperative antibiotics:  Anti-infectives (From admission, onward)   Start     Dose/Rate Route Frequency Ordered Stop   08/14/19 0600  ceFAZolin (ANCEF) 3 g in dextrose 5 % 50 mL IVPB     3 g 100 mL/hr over 30 Minutes Intravenous On call to O.R. 08/13/19 5329 08/14/19 1648       Patient was given sequential compression devices, early ambulation, and chemoprophylaxis to prevent DVT.  Patient benefited maximally from hospital stay and there were no complications.    Recent vital signs:  Patient Vitals for the past 24 hrs:  BP Temp Temp src Pulse Resp SpO2 Height Weight  08/15/19 0513 127/77 97.7 F (36.5 C) Oral 92 18 96 % - -  08/15/19 0053 137/79 97.9 F (36.6 C) Oral 100 20 96 % - -  08/14/19 2043 - - - 98 18 95 % - -  08/14/19 2025 136/60 98.1 F (36.7 C) Oral 99 20 94 % - -  08/14/19 1931 (!) 107/48 97.9 F (36.6 C) Oral 94 20 93 % - -  08/14/19 1841 134/76 (!) 97.5 F (36.4 C) Oral 91 16 96 % - -  08/14/19 1815  (!) 123/58 97.8 F (36.6 C) - 95 (!) 27 93 % - -  08/14/19 1800 (!) 135/57 - - (!) 102 (!) 27 94 % - -  08/14/19 1745 130/89 - - (!) 102 19 99 % - -  08/14/19 1743 (!) 143/82 98.6 F (37 C) - 96 14 99 % - -  08/14/19 1545 (!) 108/56 - - 79 (!) 25 100 % - -  08/14/19 1540 103/74 - - 82 (!) 25 100 % - -  08/14/19 1535 (!) 101/55 - - 85 (!) 22 100 % - -  08/14/19 1530 (!) 106/57 - - 86 14 100 % - -  08/14/19 1525 (!) 104/56 - - 81 (!) 23 100 % - -  08/14/19 1520 (!) 107/57 - - 83 (!) 22 100 % - -  08/14/19 1515 114/67 - - 81 16 100 % - -  08/14/19 1510 (!) 120/46 - - 92 17 100 % - -  08/14/19 1505 (!) 152/67 - - 78 17 100 % - -  08/14/19 1500 (!) 151/73 - - 92 20 100 % - -  08/14/19 1455 (!) 141/74 - - 87 (!) 22 100 % - -  08/14/19 1450 (!) 154/63 - - 89 (!) 22 100 % - -  08/14/19 1330 - - - - - - 5\' 6"  (1.676 m) (!) 175.5 kg  08/14/19 1301 (!) 161/95 98.4 F (36.9 C) Oral 86 18 98 % - -     Recent laboratory studies: No results for input(s): WBC, HGB, HCT, PLT, NA, K, CL, CO2, BUN, CREATININE, GLUCOSE, INR, CALCIUM in the last 72 hours.  Invalid input(s): PT, 2   Discharge Medications:   Allergies as of 08/15/2019   No Known Allergies     Medication List    TAKE these medications   HYDROcodone-acetaminophen 5-325 MG tablet Commonly known as: NORCO/VICODIN Take 1-2 tablets by mouth every 6 (six) hours as needed for moderate pain (pain score 4-6).   ibuprofen 200 MG tablet Commonly known as: ADVIL Take 400 mg by mouth every 6 (six) hours as needed for moderate pain.       Diagnostic Studies: No results found.  Disposition: Discharge disposition: 01-Home or Self Care       Discharge Instructions    Call MD / Call 911   Complete by: As directed    If you experience chest pain or shortness of breath, CALL 911 and be transported to the hospital emergency room.  If you develope a fever above 101 F, pus (white drainage) or increased drainage or redness at the wound,  or calf pain, call your surgeon's office.   Constipation Prevention   Complete by: As directed    Drink plenty of fluids.  Prune juice may be helpful.  You may use a stool softener, such as Colace (over the counter) 100 mg twice a day.  Use MiraLax (over the counter) for constipation as needed.   Diet - low sodium heart healthy   Complete by: As directed    Discharge instructions   Complete by: As directed    Arthroscopic Shoulder Surgery Post -OP Instructions  You have just had an arthroscopic operation. Your incisions (puncture sites) are small and should heal quickly. The structures inside your shoulder  may take 6-8 weeks to heal and settle down.   Pain Medication: You will be given a prescription for pain medication. Please take the medication as needed. Most patients require pain medication for only a few days up to a week.  Swelling: You can expect some swelling around  your  shoulder. Applying  ice to your shoulder will help keep the swelling to a minimum. Ice can be applied by placing ice cubes in a plastic bag and putting the bag on your shoulder with a towel between the ice bag and your skin.   The ice should be used periodically during the first 48 hours. The swelling should subside over the next several days.  Dressing: Fluid leakage is common the first night. Precautions to prevent staining of your clothes, bed sheets, etc., should be taken. This fluid was used to inflate the joint during surgery and is commonly tinged red from a small amount of blood. Remove your dressing tomorrow. Some bleeding or leakage from the puncture sites may occur for a few days and you should cover the puncture sites with Band-Aids until the leakage stops. You may shower the day after surgery with a Band-Aid over each suture. There is usually one suture at each of your puncture sites.    Activity: You will go home with a sling.  Unless instructed differently you may remove it and start to use your arm  tomorrow. Gradually increase activity as you can tolerate. An increase in pain or swelling with certain activities or with increased activities may indicate you are doing too much. Back up  and start building up your activities at a slower rate. You may want to use the sling for a while in public to prevent people from bumping into you.  Alternatively if your rotator cuff was repaired then you need to wear the sling (expect for showering) keeping your arm towards your side until you come in for follow-up.  All you need to do in terms of arm use if your rotator cuff has been repaired is to swing your arm gently at your side and let your elbow go straight a few times a day.     Office Check-Up: If no major problems arise and you are progressing well, we will need to see you in the office in one week unless otherwise instructed by your doctor. Please call the office to make an appointment. We will remove your sutures and discuss your surgery and rehabilitation at that time.   Increase activity slowly as tolerated   Complete by: As directed       Follow-up Information    Marcene Corningalldorf, Peter, MD. Schedule an appointment as soon as possible for a visit in 1 week.   Specialty: Orthopedic Surgery Contact information: [address] [place] [address] [phone]            Signed: Ginger Organndrew Paul Clydine Parkison 08/15/2019, 8:04 AM

## 2019-08-15 NOTE — Progress Notes (Signed)
Subjective: 1 Day Post-Op Procedure(s) (LRB): RIGHT ACROMIOPLASTY, DISTAL CLAVU;ECTOMY AND DEBRIDEMENT (Right)  Patient having no real pain this morning. He does feel a little groggy. He states he is ready to go home.  Activity level:  Sling for comfort Diet tolerance:  ok Voiding:  ok Patient reports pain as mild.    Objective: Vital signs in last 24 hours: Temp:  [97.5 F (36.4 C)-98.6 F (37 C)] 97.7 F (36.5 C) (10/28 0513) Pulse Rate:  [78-102] 92 (10/28 0513) Resp:  [14-27] 18 (10/28 0513) BP: (101-161)/(46-95) 127/77 (10/28 0513) SpO2:  [93 %-100 %] 96 % (10/28 0513) Weight:  [175.5 kg] 175.5 kg (10/27 1330)  Labs: No results for input(s): HGB in the last 72 hours. No results for input(s): WBC, RBC, HCT, PLT in the last 72 hours. No results for input(s): NA, K, CL, CO2, BUN, CREATININE, GLUCOSE, CALCIUM in the last 72 hours. No results for input(s): LABPT, INR in the last 72 hours.  Physical Exam:  Neurologically intact ABD soft Neurovascular intact Sensation intact distally Intact pulses distally Dorsiflexion/Plantar flexion intact Incision: dressing C/D/I and no drainage No cellulitis present Compartment soft  Assessment/Plan:  1 Day Post-Op Procedure(s) (LRB): RIGHT ACROMIOPLASTY, DISTAL CLAVU;ECTOMY AND DEBRIDEMENT (Right) Advance diet Up with therapy D/C IV fluids  Discharge home this morning.  Follow up in office 1 week post op.  Mason Walker 08/15/2019, 8:00 AM

## 2020-04-29 ENCOUNTER — Ambulatory Visit: Payer: Self-pay | Admitting: Nurse Practitioner

## 2020-06-06 ENCOUNTER — Ambulatory Visit (INDEPENDENT_AMBULATORY_CARE_PROVIDER_SITE_OTHER): Payer: Commercial Managed Care - PPO | Admitting: Nurse Practitioner

## 2020-06-06 ENCOUNTER — Encounter: Payer: Self-pay | Admitting: Nurse Practitioner

## 2020-06-06 ENCOUNTER — Other Ambulatory Visit: Payer: Self-pay

## 2020-06-06 VITALS — BP 129/81 | HR 97 | Temp 98.3°F | Ht 66.0 in | Wt >= 6400 oz

## 2020-06-06 DIAGNOSIS — F901 Attention-deficit hyperactivity disorder, predominantly hyperactive type: Secondary | ICD-10-CM | POA: Diagnosis not present

## 2020-06-06 MED ORDER — ATOMOXETINE HCL 10 MG PO CAPS: 10.0000 mg | ORAL_CAPSULE | Freq: Every day | ORAL | 0 refills | Status: DC

## 2020-06-06 NOTE — Assessment & Plan Note (Signed)
Patient is a 24 year old male who presents for ADHD.  Patient reports in the last 3 years he has had remission from ADHD until he started a new job about 3 months ago.  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  He is unable to focus, he is fidgety all the time and very distracted.  He finds himself doing everything else but his work and after a few hours he discovers he has not really accomplished anything for the day.  Patient did use Vyvanse in the past and reports effectiveness of medication and would like to be reevaluated to go back on the same medication today.  After assessment patient is reporting increased focus issues, constant fidgeting, and increased distraction. Started patient on Strattera 10 mg daily for 4 weeks and follow-up for reassessment. Patient will benefit from psychiatric evaluation. Education provided with printed handout given. Rx sent to pharmacy.

## 2020-06-06 NOTE — Assessment & Plan Note (Signed)
Concerning patient's morbid obesity patient is still overweight and has not lost any weight in the last few months but gained 24 pounds in the last year.  Current weight is 411lb from 387 lb. patient was first diagnosed 08/03/2019.  Patient just started a new job and hoping  make lifestyle changes.  Continue diet and exercise modification.

## 2020-06-06 NOTE — Progress Notes (Signed)
Established Patient Office Visit  Subjective:  Patient ID: Mason Walker, male    DOB: 1996/10/18  Age: 24 y.o. MRN: [id number]  CC:  Chief Complaint  Patient presents with   ADHD    HPI Mason Walker is a 24 year old male who presents for ADHD.  Patient reports in the last 3 years he has had remission from ADHD until he started a new job about 3 months ago.  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  He finds himself doing everything else but his work and after a few hours he discovers he has not really accomplished anything for the day patient did use Vyvanse in the past and reports effectiveness of medication.  And would like to be reevaluated to go back on the same medication today.  Past Medical History:  Diagnosis Date   Allergy    Asthma    HTN (hypertension)    no meds at this time   Pneumonia 10/2018   Sickle cell trait (HCC)    Sleep apnea    c-pap    Past Surgical History:  Procedure Laterality Date   FRACTURE SURGERY Left    age 59 and age 89   SHOULDER ARTHROSCOPY Right 08/14/2019   Procedure: RIGHT ACROMIOPLASTY, DISTAL CLAVU;ECTOMY AND DEBRIDEMENT;  Surgeon: Marcene Corning, MD;  Location: [facility];  Service: Orthopedics;  Laterality: Right;   TONSILLECTOMY AND ADENOIDECTOMY      Family History  Problem Relation Age of Onset   Fibromyalgia Mother    Diabetes Father    Hypertension Father    Hypertension Sister    Hypertension Maternal Grandmother    Hypertension Maternal Grandfather    Alcohol abuse Paternal Grandmother    Heart disease Paternal Grandfather     Social History   Socioeconomic History   Marital status: Single    Spouse name: Not on file   Number of children: Not on file   Years of education: Not on file   Highest education level: Not on file  Occupational History    Occupation: student  Tobacco Use   Smoking status: Never Smoker   Smokeless tobacco: Never Used  Building services engineer Use: Never used  Substance and Sexual Activity   Alcohol use: No    Comment: rare   Drug use: No   Sexual activity: Not on file  Other Topics Concern   Not on file  Social History Narrative   Not on file   Social Determinants of Health   Financial Resource Strain:    Difficulty of Paying Living Expenses: Not on file  Food Insecurity:    Worried About Programme researcher, broadcasting/film/video in the Last Year: Not on file   [employer] of Food in the Last Year: Not on file  Transportation Needs:    Lack of Transportation (Medical): Not on file   Lack of Transportation (Non-Medical): Not on file  Physical Activity:    Days of Exercise per Week: Not on file   Minutes of Exercise per Session: Not on file  Stress:    Feeling of Stress : Not on file  Social Connections:    Frequency of Communication with Friends and Family: Not on file   Frequency of Social Gatherings with Friends and Family: Not on file   Attends Religious Services: Not on file   Active  Member of Clubs or Organizations: Not on file   Attends Banker Meetings: Not on file   Marital Status: Not on file  Intimate Partner Violence:    Fear of Current or Ex-Partner: Not on file   Emotionally Abused: Not on file   Physically Abused: Not on file   Sexually Abused: Not on file    Outpatient Medications Prior to Visit  Medication Sig Dispense Refill   HYDROcodone-acetaminophen (NORCO/VICODIN) 5-325 MG tablet Take 1-2 tablets by mouth every 6 (six) hours as needed for moderate pain (pain score 4-6). 20 tablet 0   ibuprofen (ADVIL) 200 MG tablet Take 400 mg by mouth every 6 (six) hours as needed for moderate pain.     No facility-administered medications prior to visit.    No Known Allergies  ROS Review of Systems  Constitutional: Negative.   HENT: Negative.   Respiratory:  Negative.   Cardiovascular: Negative.   Gastrointestinal: Negative.   Genitourinary: Negative.   Musculoskeletal: Negative.   Skin: Negative.   Psychiatric/Behavioral: The patient is nervous/anxious.       Objective:    Physical Exam Vitals reviewed.  Constitutional:      Appearance: Normal appearance. He is obese.  HENT:     Head: Normocephalic.  Cardiovascular:     Rate and Rhythm: Normal rate and regular rhythm.     Pulses: Normal pulses.     Heart sounds: Normal heart sounds.  Pulmonary:     Effort: Pulmonary effort is normal.     Breath sounds: Normal breath sounds.  Abdominal:     General: Bowel sounds are normal.  Musculoskeletal:        General: Normal range of motion.  Skin:    General: Skin is warm.     Findings: Erythema present.     Comments: Left shine (lower left leg)  Neurological:     Mental Status: He is alert and oriented to person, place, and time.  Psychiatric:        Behavior: Behavior normal.     BP 129/81    Pulse 97    Temp 98.3 F (36.8 C)    Ht 5\' 6"  (1.676 m)    Wt (!) 411 lb 12.8 oz (186.8 kg)    SpO2 96%    BMI 66.47 kg/m  Wt Readings from Last 3 Encounters:  06/06/20 (!) 411 lb 12.8 oz (186.8 kg)  08/14/19 (!) 387 lb (175.5 kg)  08/08/19 (!) 387 lb (175.5 kg)     Health Maintenance Due  Topic Date Due   Hepatitis C Screening  Never done   HIV Screening  Never done    There are no preventive care reminders to display for this patient.  Lab Results  Component Value Date   TSH 1.380 08/03/2019   Lab Results  Component Value Date   WBC 8.0 08/08/2019   HGB 15.4 08/08/2019   HCT 47.2 08/08/2019   MCV 86.6 08/08/2019   PLT 243 08/08/2019   Lab Results  Component Value Date   NA 141 08/03/2019   K 4.1 08/03/2019   CO2 22 08/03/2019   GLUCOSE 119 (H) 08/03/2019   BUN 9 08/03/2019   CREATININE 0.92 08/03/2019   BILITOT 0.4 08/03/2019   ALKPHOS 90 08/03/2019   AST 31 08/03/2019   ALT 52 (H) 08/03/2019   PROT 6.3  08/03/2019   ALBUMIN 3.5 (L) 08/03/2019   CALCIUM 9.3 08/03/2019   Lab Results  Component Value Date   HGBA1C  5.4 08/08/2019      Assessment & Plan:   Problem List Items Addressed This Visit      Other   Morbid obesity (HCC)    Concerning patient's morbid obesity patient is still overweight and has not lost any weight in the last few months but gained 24 pounds in the last year.  Current weight is 411lb from 387 lb. patient was first diagnosed 08/03/2019.  Patient just started a new job and hoping  make lifestyle changes.  Continue diet and exercise modification.      Relevant Medications   atomoxetine (STRATTERA) 10 MG capsule   Attention deficit hyperactivity disorder (ADHD), predominantly hyperactive type - Primary    Patient is a 24 year old male who presents for ADHD.  Patient reports in the last 3 years he has had remission from ADHD until he started a new job about 3 months ago.  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  He is unable to focus, he is fidgety all the time and very distracted.  He finds himself doing everything else but his work and after a few hours he discovers he has not really accomplished anything for the day.  Patient did use Vyvanse in the past and reports effectiveness of medication and would like to be reevaluated to go back on the same medication today.  After assessment patient is reporting increased focus issues, constant fidgeting, and increased distraction. Started patient on Strattera 10 mg daily for 4 weeks and follow-up for reassessment. Patient will benefit from psychiatric evaluation. Education provided with printed handout given. Rx sent to pharmacy.      Relevant Medications   atomoxetine (STRATTERA) 10 MG capsule      Meds ordered this encounter  Medications   atomoxetine (STRATTERA) 10 MG capsule    Sig: Take 1 capsule (10 mg total) by mouth daily.    Dispense:   30 capsule    Refill:  0    Order Specific Question:   Supervising Provider    Answer:   Arville Care A [[id number]]    Follow-up: Return in about 4 weeks (around 07/04/2020).    Daryll Drown, NP

## 2020-06-06 NOTE — Patient Instructions (Addendum)
Morbid obesity (HCC) Concerning patient's morbid obesity patient is still overweight and has not lost any weight in the last few months but gained 24 pounds in the last year.  Current weight is 411lb from 387 lb. patient was first diagnosed 08/03/2019.  Patient just started a new job and hoping  make lifestyle changes.  Continue diet and exercise modification.  Attention deficit hyperactivity disorder (ADHD), predominantly hyperactive type Patient is a 24 year old male who presents for ADHD.  Patient reports in the last 3 years he has had remission from ADHD until he started a new job about 3 months ago.  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  He is unable to focus, he is fidgety all the time and very distracted.  He finds himself doing everything else but his work and after a few hours he discovers he has not really accomplished anything for the day.  Patient did use Vyvanse in the past and reports effectiveness of medication and would like to be reevaluated to go back on the same medication today.  After assessment patient is reporting increased focus issues, constant fidgeting, and increased distraction. Started patient on Strattera 10 mg daily for 4 weeks and follow-up for reassessment. Patient will benefit from psychiatric evaluation. Education provided with printed handout given. Rx sent to pharmacy.   Calorie Counting for Weight Loss Calories are units of energy. Your body needs a certain amount of calories from food to keep you going throughout the day. When you eat more calories than your body needs, your body stores the extra calories as fat. When you eat fewer calories than your body needs, your body burns fat to get the energy it needs. Calorie counting means keeping track of how many calories you eat and drink each day. Calorie counting can be helpful if you need to lose weight. If you make sure to eat fewer calories  than your body needs, you should lose weight. Ask your health care provider what a healthy weight is for you. For calorie counting to work, you will need to eat the right number of calories in a day in order to lose a healthy amount of weight per week. A dietitian can help you determine how many calories you need in a day and will give you suggestions on how to reach your calorie goal.  A healthy amount of weight to lose per week is usually 1-2 lb (0.5-0.9 kg). This usually means that your daily calorie intake should be reduced by 500-750 calories.  Eating 1,200 - 1,500 calories per day can help most women lose weight.  Eating 1,500 - 1,800 calories per day can help most men lose weight. What is my plan? My goal is to have __________ calories per day. If I have this many calories per day, I should lose around __________ pounds per week. What do I need to know about calorie counting? In order to meet your daily calorie goal, you will need to:  Find out how many calories are in each food you would like to eat. Try to do this before you eat.  Decide how much of the food you plan to eat.  Write down what you ate and how many calories it had. Doing this is called keeping a food log. To successfully lose weight, it is important to balance calorie counting with a healthy lifestyle that includes regular activity. Aim for 150 minutes of moderate exercise (such  as walking) or 75 minutes of vigorous exercise (such as running) each week. Where do I find calorie information?  The number of calories in a food can be found on a Nutrition Facts label. If a food does not have a Nutrition Facts label, try to look up the calories online or ask your dietitian for help. Remember that calories are listed per serving. If you choose to have more than one serving of a food, you will have to multiply the calories per serving by the amount of servings you plan to eat. For example, the label on a package of bread might  say that a serving size is 1 slice and that there are 90 calories in a serving. If you eat 1 slice, you will have eaten 90 calories. If you eat 2 slices, you will have eaten 180 calories. How do I keep a food log? Immediately after each meal, record the following information in your food log:  What you ate. Don't forget to include toppings, sauces, and other extras on the food.  How much you ate. This can be measured in cups, ounces, or number of items.  How many calories each food and drink had.  The total number of calories in the meal. Keep your food log near you, such as in a small notebook in your pocket, or use a mobile app or website. Some programs will calculate calories for you and show you how many calories you have left for the day to meet your goal. What are some calorie counting tips?   Use your calories on foods and drinks that will fill you up and not leave you hungry: ? Some examples of foods that fill you up are nuts and nut butters, vegetables, lean proteins, and high-fiber foods like whole grains. High-fiber foods are foods with more than 5 g fiber per serving. ? Drinks such as sodas, specialty coffee drinks, alcohol, and juices have a lot of calories, yet do not fill you up.  Eat nutritious foods and avoid empty calories. Empty calories are calories you get from foods or beverages that do not have many vitamins or protein, such as candy, sweets, and soda. It is better to have a nutritious high-calorie food (such as an avocado) than a food with few nutrients (such as a bag of chips).  Know how many calories are in the foods you eat most often. This will help you calculate calorie counts faster.  Pay attention to calories in drinks. Low-calorie drinks include water and unsweetened drinks.  Pay attention to nutrition labels for "low fat" or "fat free" foods. These foods sometimes have the same amount of calories or more calories than the full fat versions. They also often  have added sugar, starch, or salt, to make up for flavor that was removed with the fat.  Find a way of tracking calories that works for you. Get creative. Try different apps or programs if writing down calories does not work for you. What are some portion control tips?  Know how many calories are in a serving. This will help you know how many servings of a certain food you can have.  Use a measuring cup to measure serving sizes. You could also try weighing out portions on a kitchen scale. With time, you will be able to estimate serving sizes for some foods.  Take some time to put servings of different foods on your favorite plates, bowls, and cups so you know what a serving looks like.  Try not to eat straight from a bag or box. Doing this can lead to overeating. Put the amount you would like to eat in a cup or on a plate to make sure you are eating the right portion.  Use smaller plates, glasses, and bowls to prevent overeating.  Try not to multitask (for example, watch TV or use your computer) while eating. If it is time to eat, sit down at a table and enjoy your food. This will help you to know when you are full. It will also help you to be aware of what you are eating and how much you are eating. What are tips for following this plan? Reading food labels  Check the calorie count compared to the serving size. The serving size may be smaller than what you are used to eating.  Check the source of the calories. Make sure the food you are eating is high in vitamins and protein and low in saturated and trans fats. Shopping  Read nutrition labels while you shop. This will help you make healthy decisions before you decide to purchase your food.  Make a grocery list and stick to it. Cooking  Try to cook your favorite foods in a healthier way. For example, try baking instead of frying.  Use low-fat dairy products. Meal planning  Use more fruits and vegetables. Half of your plate should be  fruits and vegetables.  Include lean proteins like poultry and fish. How do I count calories when eating out?  Ask for smaller portion sizes.  Consider sharing an entree and sides instead of getting your own entree.  If you get your own entree, eat only half. Ask for a box at the beginning of your meal and put the rest of your entree in it so you are not tempted to eat it.  If calories are listed on the menu, choose the lower calorie options.  Choose dishes that include vegetables, fruits, whole grains, low-fat dairy products, and lean protein.  Choose items that are boiled, broiled, grilled, or steamed. Stay away from items that are buttered, battered, fried, or served with cream sauce. Items labeled "crispy" are usually fried, unless stated otherwise.  Choose water, low-fat milk, unsweetened iced tea, or other drinks without added sugar. If you want an alcoholic beverage, choose a lower calorie option such as a glass of wine or light beer.  Ask for dressings, sauces, and syrups on the side. These are usually high in calories, so you should limit the amount you eat.  If you want a salad, choose a garden salad and ask for grilled meats. Avoid extra toppings like bacon, cheese, or fried items. Ask for the dressing on the side, or ask for olive oil and vinegar or lemon to use as dressing.  Estimate how many servings of a food you are given. For example, a serving of cooked rice is  cup or about the size of half a baseball. Knowing serving sizes will help you be aware of how much food you are eating at restaurants. The list below tells you how big or small some common portion sizes are based on everyday objects: ? 1 oz--4 stacked dice. ? 3 oz--1 deck of cards. ? 1 tsp--1 die. ? 1 Tbsp-- a ping-pong ball. ? 2 Tbsp--1 ping-pong ball. ?  cup-- baseball. ? 1 cup--1 baseball. Summary  Calorie counting means keeping track of how many calories you eat and drink each day. If you eat fewer  calories than your  body needs, you should lose weight.  A healthy amount of weight to lose per week is usually 1-2 lb (0.5-0.9 kg). This usually means reducing your daily calorie intake by 500-750 calories.  The number of calories in a food can be found on a Nutrition Facts label. If a food does not have a Nutrition Facts label, try to look up the calories online or ask your dietitian for help.  Use your calories on foods and drinks that will fill you up, and not on foods and drinks that will leave you hungry.  Use smaller plates, glasses, and bowls to prevent overeating. This information is not intended to replace advice given to you by your health care provider. Make sure you discuss any questions you have with your health care provider. Document Revised: 06/23/2018 Document Reviewed: 09/03/2016 Elsevier Patient Education  2020 [employer]. Attention Deficit Hyperactivity Disorder, Adult Attention deficit hyperactivity disorder (ADHD) is a mental health disorder that starts during childhood (neurodevelopmental disorder). For many people with ADHD, the disorder continues into the adult years. Treatment can help you manage your symptoms. What are the causes? The exact cause of ADHD is not known. Most experts believe genetics and environmental factors contribute to ADHD. What increases the risk? The following factors may make you more likely to develop this condition:  Having a family history of ADHD.  Being male.  Being born to a mother who smoked or drank alcohol during pregnancy.  Being exposed to lead or other toxins in the womb or early in life.  Being born before 37 weeks of pregnancy (prematurely) or at a low birth weight.  Having experienced a brain injury. What are the signs or symptoms? Symptoms of this condition depend on the type of ADHD. The two main types are inattentive and hyperactive-impulsive. Some people may have symptoms of both types. Symptoms of the inattentive  type include:  Difficulty paying attention.  Making careless mistakes.  Not following instructions.  Being disorganized.  Avoiding tasks that require time and attention.  Losing and forgetting things.  Being easily distracted. Symptoms of the hyperactive-impulsive type include:  Restlessness.  Talking too much.  Interrupting.  Difficulty with: ? Sitting still. ? Feeling motivated. ? Relaxing. ? Waiting in line or waiting for a turn. In adults, this condition may lead to certain problems, such as:  Keeping jobs.  Performing tasks at work.  Having stable relationships.  Being on time or keeping to a schedule. How is this diagnosed? This condition is diagnosed based on your current symptoms and your history of symptoms. The diagnosis can be made by a health care provider such as a primary care provider or a mental health care specialist. Your health care provider may use a symptom checklist or a behavior rating scale to evaluate your symptoms. He or she may also want to talk with people who have observed your behaviors throughout your life. How is this treated? This condition can be treated with medicines and behavior therapy. Medicines may be the best option to reduce impulsive behaviors and improve attention. Your health care provider may recommend:  Stimulant medicines. These are the most common medicines used for adult ADHD. They affect certain chemicals in the brain (neurotransmitters) and improve your ability to control your symptoms.  A non-stimulant medicine for adult ADHD (atomoxetine). This medicine increases a neurotransmitter called norepinephrine. It may take weeks to months to see effects from this medicine. Counseling and behavioral management are also important for treating ADHD. Counseling is often  used along with medicine. Your health care provider may suggest:  Cognitive behavioral therapy (CBT). This type of therapy teaches you to replace negative  thoughts and actions with positive thoughts and actions. When used as part of ADHD treatment, this therapy may also include: ? Coping strategies for organization, time management, impulse control, and stress reduction. ? Mindfulness and meditation training.  Behavioral management. You may work with a Psychologist, occupationalcoach who is specially trained to help people with ADHD manage and organize activities and function more effectively. Follow these instructions at home: Medicines   Take over-the-counter and prescription medicines only as told by your health care provider.  Talk with your health care provider about the possible side effects of your medicines and how to manage them. Lifestyle   Do not use drugs.  Do not drink alcohol if: ? Your health care provider tells you not to drink. ? You are pregnant, may be pregnant, or are planning to become pregnant.  If you drink alcohol: ? Limit how much you use to:  0-1 drink a day for women.  0-2 drinks a day for men. ? Be aware of how much alcohol is in your drink. In the [place] one drink equals one 12 oz bottle of beer (355 mL), one 5 oz glass of wine (148 mL), or one 1 oz glass of hard liquor (44 mL).  Get enough sleep.  Eat a healthy diet.  Exercise regularly. Exercise can help to reduce stress and anxiety. General instructions  Learn as much as you can about adult ADHD, and work closely with your health care providers to find the treatments that work best for you.  Follow the same schedule each day.  Use reminder devices like notes, calendars, and phone apps to stay on time and organized.  Keep all follow-up visits as told by your health care provider and therapist. This is important. Where to find more information A health care provider may be able to recommend resources that are available online or over the phone. You could start with:  Attention Deficit Disorder Association (ADDA): http://davis-dillon.net/www.add.org  [employer]  [facility](NIMH): http://www.maynard.net/www.nimh.nih.gov Contact a health care provider if:  Your symptoms continue to cause problems.  You have side effects from your medicine, such as: ? Repeated muscle twitches, coughing, or speech outbursts. ? Sleep problems. ? Loss of appetite. ? Dizziness. ? Unusually fast heartbeat. ? Stomach pains. ? Headaches.  You are struggling with anxiety, depression, or substance abuse. Get help right away if you:  Have a severe reaction to a medicine. If you ever feel like you may hurt yourself or others, or have thoughts about taking your own life, get help right away. You can go to the nearest emergency department or call:  Your local emergency services (911 in the [place]  A suicide crisis helpline, such as the National Suicide Prevention Lifeline at (737)009-88661-236 094 0030. This is open 24 hours a day. Summary  ADHD is a mental health disorder that starts during childhood (neurodevelopmental disorder) and often continues into the adult years.  The exact cause of ADHD is not known. Most experts believe genetics and environmental factors contribute to ADHD.  There is no cure for ADHD, but treatment with medicine, cognitive behavioral therapy, or behavioral management can help you manage your condition. This information is not intended to replace advice given to you by your health care provider. Make sure you discuss any questions you have with your health care provider. Document Revised: 02/26/2019 Document Reviewed: 02/26/2019  Elsevier Patient Education  [employer].

## 2020-06-30 ENCOUNTER — Other Ambulatory Visit: Payer: Self-pay | Admitting: *Deleted

## 2020-06-30 DIAGNOSIS — F901 Attention-deficit hyperactivity disorder, predominantly hyperactive type: Secondary | ICD-10-CM

## 2020-07-02 ENCOUNTER — Other Ambulatory Visit: Payer: Self-pay | Admitting: Family Medicine

## 2020-07-02 DIAGNOSIS — F901 Attention-deficit hyperactivity disorder, predominantly hyperactive type: Secondary | ICD-10-CM

## 2020-07-04 ENCOUNTER — Ambulatory Visit: Payer: Commercial Managed Care - PPO | Admitting: Nurse Practitioner

## 2020-07-07 ENCOUNTER — Ambulatory Visit (INDEPENDENT_AMBULATORY_CARE_PROVIDER_SITE_OTHER): Payer: Commercial Managed Care - PPO | Admitting: Nurse Practitioner

## 2020-07-07 ENCOUNTER — Encounter: Payer: Self-pay | Admitting: Nurse Practitioner

## 2020-07-07 ENCOUNTER — Other Ambulatory Visit: Payer: Self-pay

## 2020-07-07 VITALS — BP 141/84 | HR 90 | Temp 98.1°F | Ht 66.0 in | Wt >= 6400 oz

## 2020-07-07 DIAGNOSIS — R600 Localized edema: Secondary | ICD-10-CM | POA: Insufficient documentation

## 2020-07-07 DIAGNOSIS — F901 Attention-deficit hyperactivity disorder, predominantly hyperactive type: Secondary | ICD-10-CM | POA: Diagnosis not present

## 2020-07-07 MED ORDER — ATOMOXETINE HCL 40 MG PO CAPS: 40.0000 mg | ORAL_CAPSULE | Freq: Every day | ORAL | 1 refills | Status: DC

## 2020-07-07 NOTE — Progress Notes (Signed)
Established Patient Office Visit  Subjective:  Patient ID: Mason Walker, male    DOB: September 22, 1996  Age: 24 y.o. MRN: [id number]  CC:  Chief Complaint  Patient presents with  . ADHD    HPI [name] presents for follow-up of ADHD. Patient was started on 10 mg Strattera on last visit 3 weeks ago. Patient is reporting slight therapeutic effect. But still having distractions, and very minimal concentration. Patient is still reporting distractions work and difficulty concentrating. Patient is taking medication as prescribed and following up as directed. Patient is not reporting any adverse reactions to medication.    Obesity: Patient complains of obesity. Patient cites health, increased physical ability as reasons for wanting to lose weight.  Obesity History Weight in late teens: Unknown Period of greatest weight gain: current weight: 416 lb.    History of Weight Loss Efforts Successful weight loss techniques attempted: self-directed dieting Unsuccessful weight loss techniques attempted: self-directed dieting  Current Exercise Habits none  Current Eating Habits Number of regular meals per day: a few Number of snacking episodes per day: a few Who shops for food? patient Who prepares food? patient Binge behavior?: no Purge behavior? no Anorexic behavior? no Eating precipitated by stress? no Guilt feelings associated with eating? no  Other Potential Contributing Factors  Use of medications that may cause weight gain none History of past abuse? none Psych History: ADHD Comorbidities: sleep apnea/hypopnea   Past Medical History:  Diagnosis Date  . Allergy   . Asthma   . HTN (hypertension)    no meds at this time  . Pneumonia 10/2018  . Sickle cell trait (HCC)   . Sleep apnea    c-pap    Past Surgical History:  Procedure Laterality Date  . FRACTURE SURGERY Left    age 66 and age 13  . SHOULDER ARTHROSCOPY Right 08/14/2019   Procedure: RIGHT  ACROMIOPLASTY, DISTAL CLAVU;ECTOMY AND DEBRIDEMENT;  Surgeon: Marcene Corning, MD;  Location: [facility];  Service: Orthopedics;  Laterality: Right;  . TONSILLECTOMY AND ADENOIDECTOMY      Family History  Problem Relation Age of Onset  . Fibromyalgia Mother   . Diabetes Father   . Hypertension Father   . Hypertension Sister   . Hypertension Maternal Grandmother   . Hypertension Maternal Grandfather   . Alcohol abuse Paternal Grandmother   . Heart disease Paternal Grandfather       Outpatient Medications Prior to Visit  Medication Sig Dispense Refill  . atomoxetine (STRATTERA) 10 MG capsule Take 1 capsule (10 mg total) by mouth daily. 30 capsule 0   No facility-administered medications prior to visit.      ROS Review of Systems  Skin: Positive for color change and rash.       Left lower leg  All other systems reviewed and are negative.     Objective:    Physical Exam Vitals reviewed.  Constitutional:      Appearance: Normal appearance. He is obese.  HENT:     Head: Normocephalic.     Nose: Nose normal.  Eyes:     Conjunctiva/sclera: Conjunctivae normal.  Cardiovascular:     Rate and Rhythm: Normal rate and regular rhythm.     Pulses: Normal pulses.     Heart sounds: Normal heart sounds.  Pulmonary:     Effort: Pulmonary effort is normal.     Breath sounds: Normal breath sounds.  Abdominal:     General: Bowel sounds are normal.  Musculoskeletal:  General: Swelling and tenderness present.     Right lower leg: Edema present.     Left lower leg: Edema present.  Skin:    Findings: Erythema and rash present.     Comments: Left lower leg  Neurological:     Mental Status: He is alert and oriented to person, place, and time.  Psychiatric:        Mood and Affect: Mood normal.     BP (!) 141/84   Pulse 90   Temp 98.1 F (36.7 C)   Ht 5\' 6"  (1.676 m)   Wt (!) 416 lb (188.7 kg)   SpO2 96%   BMI 67.14 kg/m  Wt Readings from Last 3 Encounters:    07/07/20 (!) 416 lb (188.7 kg)  06/06/20 (!) 411 lb 12.8 oz (186.8 kg)  08/14/19 (!) 387 lb (175.5 kg)     Health Maintenance Due  Topic Date Due  . Hepatitis C Screening  Never done  . HIV Screening  Never done      Lab Results  Component Value Date   TSH 1.380 08/03/2019   Lab Results  Component Value Date   WBC 8.0 08/08/2019   HGB 15.4 08/08/2019   HCT 47.2 08/08/2019   MCV 86.6 08/08/2019   PLT 243 08/08/2019   Lab Results  Component Value Date   NA 141 08/03/2019   K 4.1 08/03/2019   CO2 22 08/03/2019   GLUCOSE 119 (H) 08/03/2019   BUN 9 08/03/2019   CREATININE 0.92 08/03/2019   BILITOT 0.4 08/03/2019   ALKPHOS 90 08/03/2019   AST 31 08/03/2019   ALT 52 (H) 08/03/2019   PROT 6.3 08/03/2019   ALBUMIN 3.5 (L) 08/03/2019   CALCIUM 9.3 08/03/2019    Lab Results  Component Value Date   HGBA1C 5.4 08/08/2019      Assessment & Plan:   Problem List Items Addressed This Visit      Other   Morbid obesity (HCC)    Patient's weight is currently not well managed. Patient is currently 416lb and gained 5 pounds in the last 3 weeks. Provided education to patient with printed handouts given. Continue diet modification and exercise.  Follow-up as needed.      Relevant Medications   atomoxetine (STRATTERA) 40 MG capsule   Other Relevant Orders   For home use only DME Other see comment   Attention deficit hyperactivity disorder (ADHD), predominantly hyperactive type - Primary    Patient is following up today for attention deficit hyperactivity disorder. ADHD is not well managed, patient is reporting little or no therapeutic effect from current medication Strattera 10 mg, changed to Strattera 40 mg daily, f/u in one week. Education provided with printed handout given to patient.         Relevant Orders   For home use only DME Other see comment   Edema, lower extremity    Patient is reporting new bilateral lower extremity edema. Bilateral lower extremity  is not hot to touch or  warm, slight pitting edema assessed. With erythema and skin rash. No signs or symptoms of infection. Patient is not reporting any fever or pain. Provided education to patient to elevate feet above heart, compression socks. Follow-up with worsening or unresolved symptoms.      Relevant Orders   For home use only DME Other see comment       Follow-up: Return in about 3 months (around 10/06/2020).    10/08/2020, NP

## 2020-07-07 NOTE — Assessment & Plan Note (Addendum)
Patient is following up today for attention deficit hyperactivity disorder. ADHD is not well managed, patient is reporting little or no therapeutic effect from current medication Strattera 10 mg, changed to Strattera 40 mg daily, f/u in one week. Education provided with printed handout given to patient.   Rx sent to pharmacy

## 2020-07-07 NOTE — Patient Instructions (Signed)
Calorie Counting for Weight Loss Calories are units of energy. Your body needs a certain amount of calories from food to keep you going throughout the day. When you eat more calories than your body needs, your body stores the extra calories as fat. When you eat fewer calories than your body needs, your body burns fat to get the energy it needs. Calorie counting means keeping track of how many calories you eat and drink each day. Calorie counting can be helpful if you need to lose weight. If you make sure to eat fewer calories than your body needs, you should lose weight. Ask your health care provider what a healthy weight is for you. For calorie counting to work, you will need to eat the right number of calories in a day in order to lose a healthy amount of weight per week. A dietitian can help you determine how many calories you need in a day and will give you suggestions on how to reach your calorie goal.  A healthy amount of weight to lose per week is usually 1-2 lb (0.5-0.9 kg). This usually means that your daily calorie intake should be reduced by 500-750 calories.  Eating 1,200 - 1,500 calories per day can help most women lose weight.  Eating 1,500 - 1,800 calories per day can help most men lose weight. What is my plan? My goal is to have __________ calories per day. If I have this many calories per day, I should lose around __________ pounds per week. What do I need to know about calorie counting? In order to meet your daily calorie goal, you will need to:  Find out how many calories are in each food you would like to eat. Try to do this before you eat.  Decide how much of the food you plan to eat.  Write down what you ate and how many calories it had. Doing this is called keeping a food log. To successfully lose weight, it is important to balance calorie counting with a healthy lifestyle that includes regular activity. Aim for 150 minutes of moderate exercise (such as walking) or 75  minutes of vigorous exercise (such as running) each week. Where do I find calorie information?  The number of calories in a food can be found on a Nutrition Facts label. If a food does not have a Nutrition Facts label, try to look up the calories online or ask your dietitian for help. Remember that calories are listed per serving. If you choose to have more than one serving of a food, you will have to multiply the calories per serving by the amount of servings you plan to eat. For example, the label on a package of bread might say that a serving size is 1 slice and that there are 90 calories in a serving. If you eat 1 slice, you will have eaten 90 calories. If you eat 2 slices, you will have eaten 180 calories. How do I keep a food log? Immediately after each meal, record the following information in your food log:  What you ate. Don't forget to include toppings, sauces, and other extras on the food.  How much you ate. This can be measured in cups, ounces, or number of items.  How many calories each food and drink had.  The total number of calories in the meal. Keep your food log near you, such as in a small notebook in your pocket, or use a mobile app or website. Some programs will calculate   calories for you and show you how many calories you have left for the day to meet your goal. What are some calorie counting tips?   Use your calories on foods and drinks that will fill you up and not leave you hungry: ? Some examples of foods that fill you up are nuts and nut butters, vegetables, lean proteins, and high-fiber foods like whole grains. High-fiber foods are foods with more than 5 g fiber per serving. ? Drinks such as sodas, specialty coffee drinks, alcohol, and juices have a lot of calories, yet do not fill you up.  Eat nutritious foods and avoid empty calories. Empty calories are calories you get from foods or beverages that do not have many vitamins or protein, such as candy, sweets, and  soda. It is better to have a nutritious high-calorie food (such as an avocado) than a food with few nutrients (such as a bag of chips).  Know how many calories are in the foods you eat most often. This will help you calculate calorie counts faster.  Pay attention to calories in drinks. Low-calorie drinks include water and unsweetened drinks.  Pay attention to nutrition labels for "low fat" or "fat free" foods. These foods sometimes have the same amount of calories or more calories than the full fat versions. They also often have added sugar, starch, or salt, to make up for flavor that was removed with the fat.  Find a way of tracking calories that works for you. Get creative. Try different apps or programs if writing down calories does not work for you. What are some portion control tips?  Know how many calories are in a serving. This will help you know how many servings of a certain food you can have.  Use a measuring cup to measure serving sizes. You could also try weighing out portions on a kitchen scale. With time, you will be able to estimate serving sizes for some foods.  Take some time to put servings of different foods on your favorite plates, bowls, and cups so you know what a serving looks like.  Try not to eat straight from a bag or box. Doing this can lead to overeating. Put the amount you would like to eat in a cup or on a plate to make sure you are eating the right portion.  Use smaller plates, glasses, and bowls to prevent overeating.  Try not to multitask (for example, watch TV or use your computer) while eating. If it is time to eat, sit down at a table and enjoy your food. This will help you to know when you are full. It will also help you to be aware of what you are eating and how much you are eating. What are tips for following this plan? Reading food labels  Check the calorie count compared to the serving size. The serving size may be smaller than what you are used to  eating.  Check the source of the calories. Make sure the food you are eating is high in vitamins and protein and low in saturated and trans fats. Shopping  Read nutrition labels while you shop. This will help you make healthy decisions before you decide to purchase your food.  Make a grocery list and stick to it. Cooking  Try to cook your favorite foods in a healthier way. For example, try baking instead of frying.  Use low-fat dairy products. Meal planning  Use more fruits and vegetables. Half of your plate should be fruits   and vegetables.  Include lean proteins like poultry and fish. How do I count calories when eating out?  Ask for smaller portion sizes.  Consider sharing an entree and sides instead of getting your own entree.  If you get your own entree, eat only half. Ask for a box at the beginning of your meal and put the rest of your entree in it so you are not tempted to eat it.  If calories are listed on the menu, choose the lower calorie options.  Choose dishes that include vegetables, fruits, whole grains, low-fat dairy products, and lean protein.  Choose items that are boiled, broiled, grilled, or steamed. Stay away from items that are buttered, battered, fried, or served with cream sauce. Items labeled "crispy" are usually fried, unless stated otherwise.  Choose water, low-fat milk, unsweetened iced tea, or other drinks without added sugar. If you want an alcoholic beverage, choose a lower calorie option such as a glass of wine or light beer.  Ask for dressings, sauces, and syrups on the side. These are usually high in calories, so you should limit the amount you eat.  If you want a salad, choose a garden salad and ask for grilled meats. Avoid extra toppings like bacon, cheese, or fried items. Ask for the dressing on the side, or ask for olive oil and vinegar or lemon to use as dressing.  Estimate how many servings of a food you are given. For example, a serving of  cooked rice is  cup or about the size of half a baseball. Knowing serving sizes will help you be aware of how much food you are eating at restaurants. The list below tells you how big or small some common portion sizes are based on everyday objects: ? 1 oz--4 stacked dice. ? 3 oz--1 deck of cards. ? 1 tsp--1 die. ? 1 Tbsp-- a ping-pong ball. ? 2 Tbsp--1 ping-pong ball. ?  cup-- baseball. ? 1 cup--1 baseball. Summary  Calorie counting means keeping track of how many calories you eat and drink each day. If you eat fewer calories than your body needs, you should lose weight.  A healthy amount of weight to lose per week is usually 1-2 lb (0.5-0.9 kg). This usually means reducing your daily calorie intake by 500-750 calories.  The number of calories in a food can be found on a Nutrition Facts label. If a food does not have a Nutrition Facts label, try to look up the calories online or ask your dietitian for help.  Use your calories on foods and drinks that will fill you up, and not on foods and drinks that will leave you hungry.  Use smaller plates, glasses, and bowls to prevent overeating. This information is not intended to replace advice given to you by your health care provider. Make sure you discuss any questions you have with your health care provider. Document Revised: 06/23/2018 Document Reviewed: 09/03/2016 Elsevier Patient Education  2020 [employer] Edema  Edema is when you have too much fluid in your body or under your skin. Edema may make your legs, feet, and ankles swell up. Swelling is also common in looser tissues, like around your eyes. This is a common condition. It gets more common as you get older. There are many possible causes of edema. Eating too much salt (sodium) and being on your feet or sitting for a long time can cause edema in your legs, feet, and ankles. Hot weather may make edema worse. Edema is usually  painless. Your skin may look swollen or shiny. Follow  these instructions at home:  Keep the swollen body part raised (elevated) above the level of your heart when you are sitting or lying down.  Do not sit still or stand for a long time.  Do not wear tight clothes. Do not wear garters on your upper legs.  Exercise your legs. This can help the swelling go down.  Wear elastic bandages or support stockings as told by your doctor.  Eat a low-salt (low-sodium) diet to reduce fluid as told by your doctor.  Depending on the cause of your swelling, you may need to limit how much fluid you drink (fluid restriction).  Take over-the-counter and prescription medicines only as told by your doctor. Contact a doctor if:  Treatment is not working.  You have heart, liver, or kidney disease and have symptoms of edema.  You have sudden and unexplained weight gain. Get help right away if:  You have shortness of breath or chest pain.  You cannot breathe when you lie down.  You have pain, redness, or warmth in the swollen areas.  You have heart, liver, or kidney disease and get edema all of a sudden.  You have a fever and your symptoms get worse all of a sudden. Summary  Edema is when you have too much fluid in your body or under your skin.  Edema may make your legs, feet, and ankles swell up. Swelling is also common in looser tissues, like around your eyes.  Raise (elevate) the swollen body part above the level of your heart when you are sitting or lying down.  Follow your doctor's instructions about diet and how much fluid you can drink (fluid restriction). This information is not intended to replace advice given to you by your health care provider. Make sure you discuss any questions you have with your health care provider. Document Revised: 10/07/2017 Document Reviewed: 10/22/2016 Elsevier Patient Education  2020 [employer]. Attention Deficit Hyperactivity Disorder, Adult Attention deficit hyperactivity disorder (ADHD) is a mental  health disorder that starts during childhood (neurodevelopmental disorder). For many people with ADHD, the disorder continues into the adult years. Treatment can help you manage your symptoms. What are the causes? The exact cause of ADHD is not known. Most experts believe genetics and environmental factors contribute to ADHD. What increases the risk? The following factors may make you more likely to develop this condition:  Having a family history of ADHD.  Being male.  Being born to a mother who smoked or drank alcohol during pregnancy.  Being exposed to lead or other toxins in the womb or early in life.  Being born before 37 weeks of pregnancy (prematurely) or at a low birth weight.  Having experienced a brain injury. What are the signs or symptoms? Symptoms of this condition depend on the type of ADHD. The two main types are inattentive and hyperactive-impulsive. Some people may have symptoms of both types. Symptoms of the inattentive type include:  Difficulty paying attention.  Making careless mistakes.  Not following instructions.  Being disorganized.  Avoiding tasks that require time and attention.  Losing and forgetting things.  Being easily distracted. Symptoms of the hyperactive-impulsive type include:  Restlessness.  Talking too much.  Interrupting.  Difficulty with: ? Sitting still. ? Feeling motivated. ? Relaxing. ? Waiting in line or waiting for a turn. In adults, this condition may lead to certain problems, such as:  Keeping jobs.  Performing tasks at work.  Having stable relationships.  Being on time or keeping to a schedule. How is this diagnosed? This condition is diagnosed based on your current symptoms and your history of symptoms. The diagnosis can be made by a health care provider such as a primary care provider or a mental health care specialist. Your health care provider may use a symptom checklist or a behavior rating scale to evaluate  your symptoms. He or she may also want to talk with people who have observed your behaviors throughout your life. How is this treated? This condition can be treated with medicines and behavior therapy. Medicines may be the best option to reduce impulsive behaviors and improve attention. Your health care provider may recommend:  Stimulant medicines. These are the most common medicines used for adult ADHD. They affect certain chemicals in the brain (neurotransmitters) and improve your ability to control your symptoms.  A non-stimulant medicine for adult ADHD (atomoxetine). This medicine increases a neurotransmitter called norepinephrine. It may take weeks to months to see effects from this medicine. Counseling and behavioral management are also important for treating ADHD. Counseling is often used along with medicine. Your health care provider may suggest:  Cognitive behavioral therapy (CBT). This type of therapy teaches you to replace negative thoughts and actions with positive thoughts and actions. When used as part of ADHD treatment, this therapy may also include: ? Coping strategies for organization, time management, impulse control, and stress reduction. ? Mindfulness and meditation training.  Behavioral management. You may work with a Psychologist, occupational who is specially trained to help people with ADHD manage and organize activities and function more effectively. Follow these instructions at home: Medicines   Take over-the-counter and prescription medicines only as told by your health care provider.  Talk with your health care provider about the possible side effects of your medicines and how to manage them. Lifestyle   Do not use drugs.  Do not drink alcohol if: ? Your health care provider tells you not to drink. ? You are pregnant, may be pregnant, or are planning to become pregnant.  If you drink alcohol: ? Limit how much you use to:  0-1 drink a day for women.  0-2 drinks a day for  men. ? Be aware of how much alcohol is in your drink. In the [place] one drink equals one 12 oz bottle of beer (355 mL), one 5 oz glass of wine (148 mL), or one 1 oz glass of hard liquor (44 mL).  Get enough sleep.  Eat a healthy diet.  Exercise regularly. Exercise can help to reduce stress and anxiety. General instructions  Learn as much as you can about adult ADHD, and work closely with your health care providers to find the treatments that work best for you.  Follow the same schedule each day.  Use reminder devices like notes, calendars, and phone apps to stay on time and organized.  Keep all follow-up visits as told by your health care provider and therapist. This is important. Where to find more information A health care provider may be able to recommend resources that are available online or over the phone. You could start with:  Attention Deficit Disorder Association (ADDA): http://davis-dillon.net/  [employer] [facility]): http://www.maynard.net/ Contact a health care provider if:  Your symptoms continue to cause problems.  You have side effects from your medicine, such as: ? Repeated muscle twitches, coughing, or speech outbursts. ? Sleep problems. ? Loss of appetite. ? Dizziness. ? Unusually fast heartbeat. ?  Stomach pains. ? Headaches.  You are struggling with anxiety, depression, or substance abuse. Get help right away if you:  Have a severe reaction to a medicine. If you ever feel like you may hurt yourself or others, or have thoughts about taking your own life, get help right away. You can go to the nearest emergency department or call:  Your local emergency services (911 in the [place]  A suicide crisis helpline, such as the National Suicide Prevention Lifeline at 561-727-6700. This is open 24 hours a day. Summary  ADHD is a mental health disorder that starts during childhood (neurodevelopmental disorder) and often continues into the adult years.  The  exact cause of ADHD is not known. Most experts believe genetics and environmental factors contribute to ADHD.  There is no cure for ADHD, but treatment with medicine, cognitive behavioral therapy, or behavioral management can help you manage your condition. This information is not intended to replace advice given to you by your health care provider. Make sure you discuss any questions you have with your health care provider. Document Revised: 02/26/2019 Document Reviewed: 02/26/2019 Elsevier Patient Education  2020 [employer].

## 2020-07-07 NOTE — Assessment & Plan Note (Signed)
Patient is reporting new bilateral lower extremity edema. Bilateral lower extremity is not hot to touch or  warm, slight pitting edema assessed. With erythema and skin rash. No signs or symptoms of infection. Patient is not reporting any fever or pain. Provided education to patient to elevate feet above heart, compression socks. Follow-up with worsening or unresolved symptoms.

## 2020-07-07 NOTE — Assessment & Plan Note (Signed)
Patient's weight is currently not well managed. Patient is currently 416lb and gained 5 pounds in the last 3 weeks. Provided education to patient with printed handouts given. Continue diet modification and exercise.  Follow-up as needed.

## 2020-08-01 ENCOUNTER — Telehealth: Payer: Self-pay | Admitting: Family Medicine

## 2020-08-01 NOTE — Telephone Encounter (Signed)
FYI pt called to let dr. Erie Noe know that adhd medication is going well and there have been no issues

## 2020-08-01 NOTE — Telephone Encounter (Signed)
Awesome!

## 2020-08-02 ENCOUNTER — Other Ambulatory Visit: Payer: Self-pay | Admitting: Family Medicine

## 2020-09-28 IMAGING — DX DG WRIST COMPLETE 3+V*L*
4 series · 4 of 4 positions shown · non-contrast
Comparison: None.

CLINICAL DATA: Fall 4 days ago.  Left wrist pain.

EXAM:
LEFT WRIST - COMPLETE 3+ VIEW

[wrist pa]
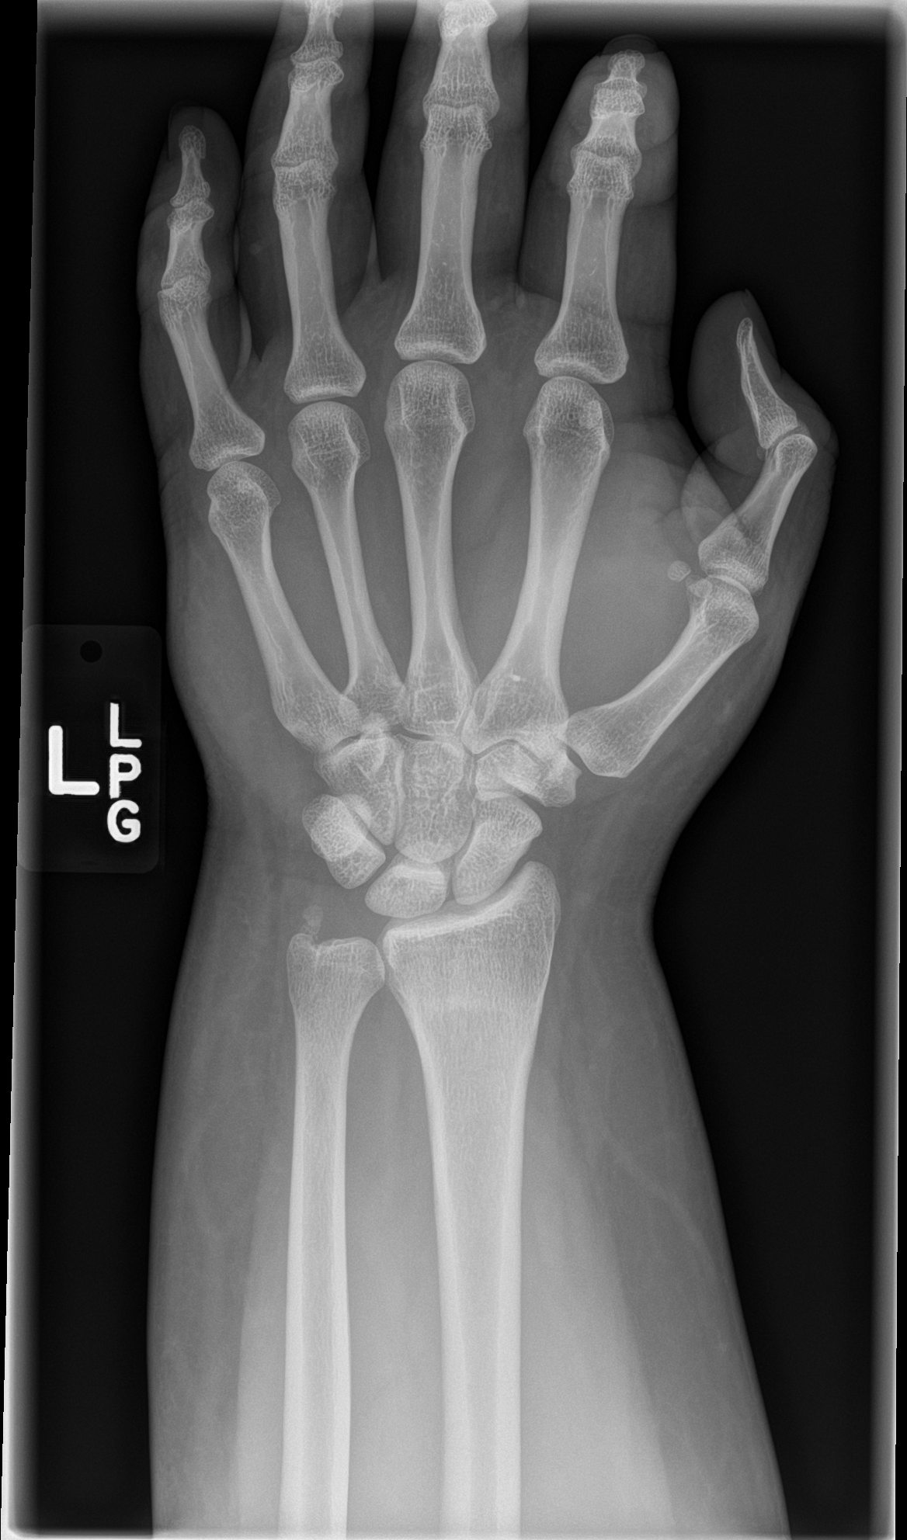

[wrist obl]
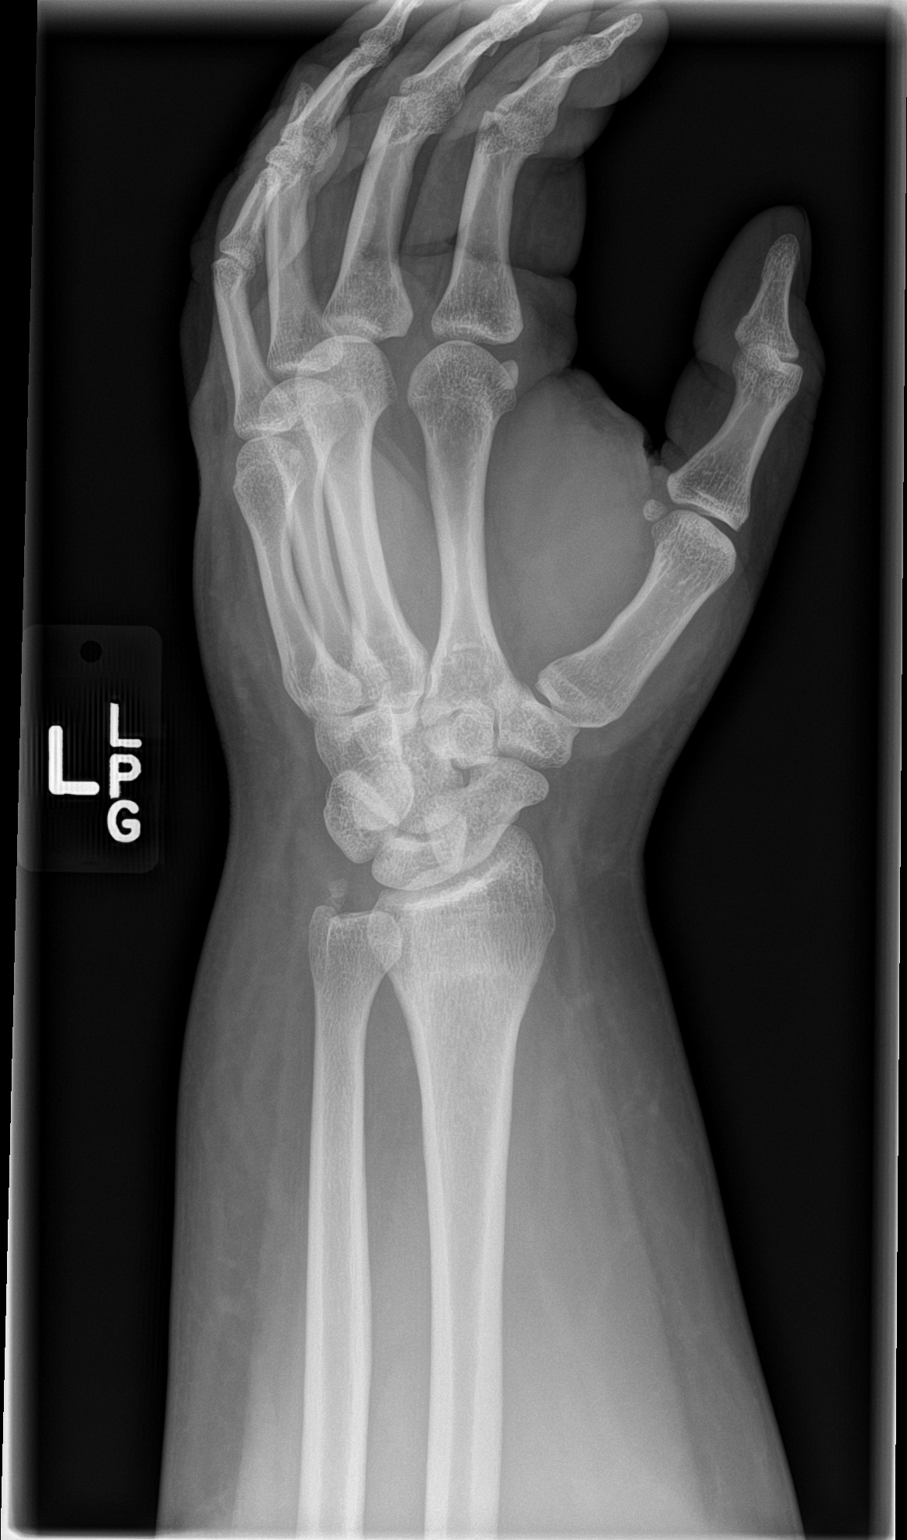

[wrist lat]
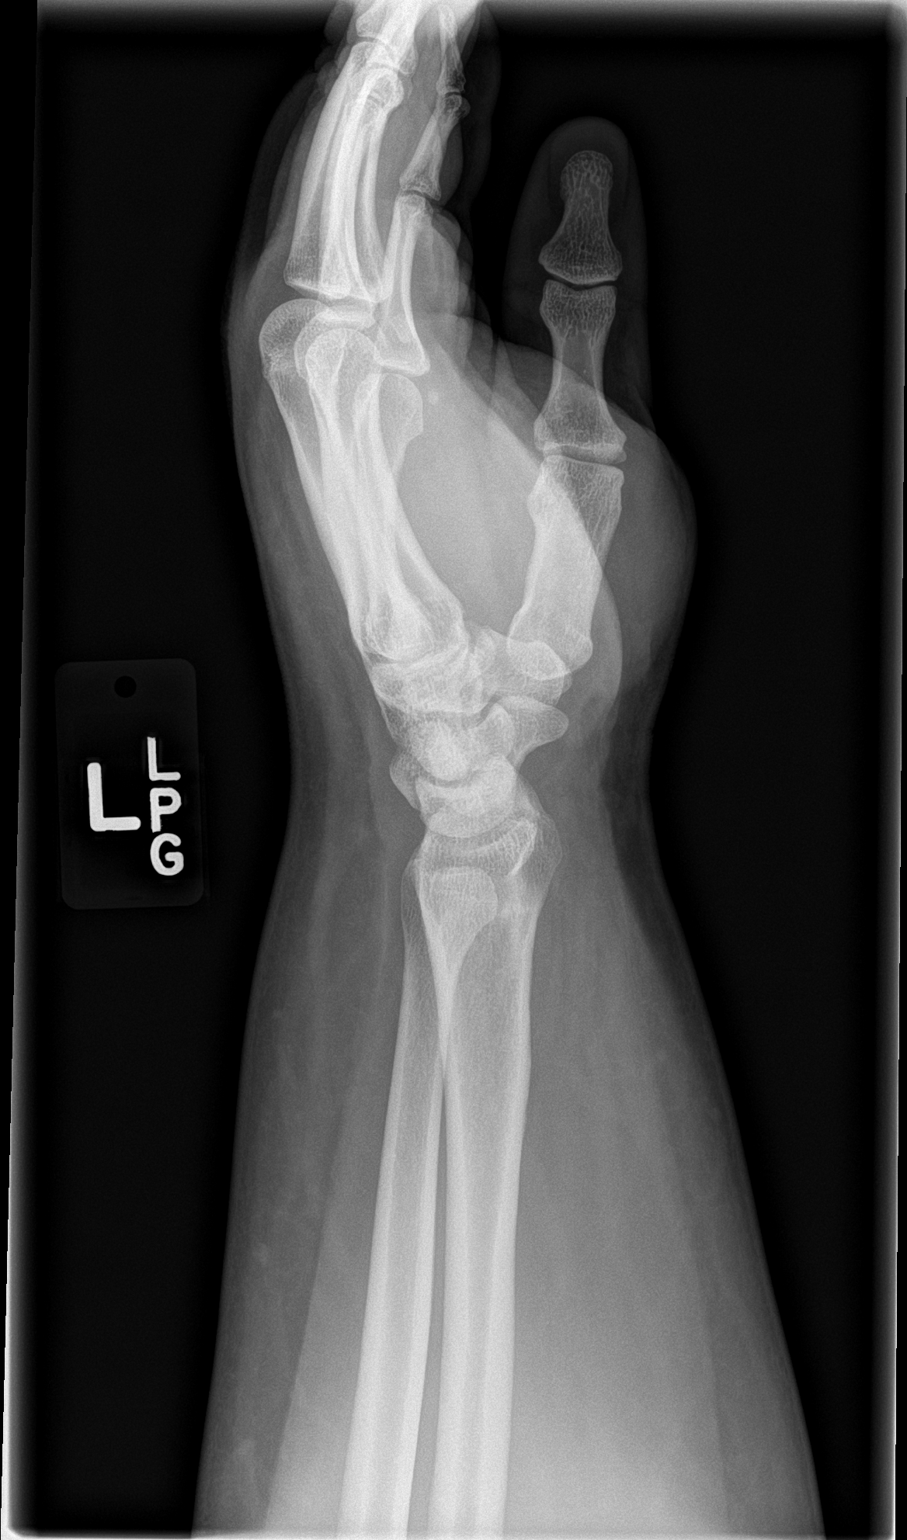

[wrist navicular]
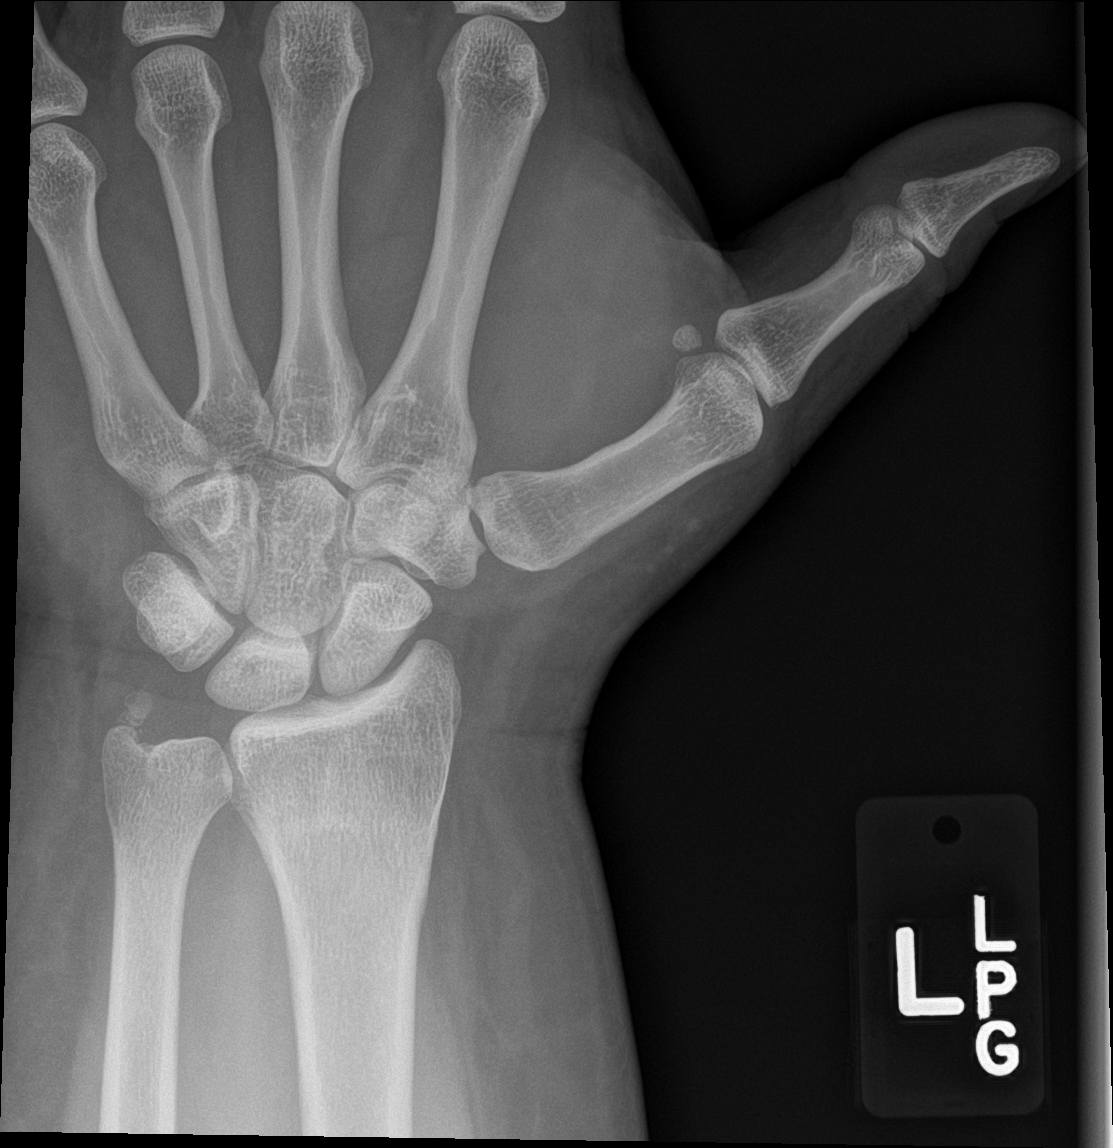

[4 of 4 positions shown; findings below may reference images not displayed]

FINDINGS: A remote left ulnar styloid fracture is present. No acute fracture
or soft tissue injury is evident.
IMPRESSION: 1. No acute abnormality.
2. Remote ulnar styloid fracture.

## 2021-05-19 ENCOUNTER — Telehealth (INDEPENDENT_AMBULATORY_CARE_PROVIDER_SITE_OTHER): Payer: Commercial Managed Care - PPO | Admitting: Nurse Practitioner

## 2021-05-19 ENCOUNTER — Encounter: Payer: Self-pay | Admitting: Nurse Practitioner

## 2021-05-19 DIAGNOSIS — J0101 Acute recurrent maxillary sinusitis: Secondary | ICD-10-CM | POA: Diagnosis not present

## 2021-05-19 DIAGNOSIS — L03116 Cellulitis of left lower limb: Secondary | ICD-10-CM | POA: Diagnosis not present

## 2021-05-19 MED ORDER — FUROSEMIDE 20 MG PO TABS: 20.0000 mg | ORAL_TABLET | Freq: Every day | ORAL | 3 refills | Status: DC

## 2021-05-19 MED ORDER — DOXYCYCLINE HYCLATE 100 MG PO TABS: 100.0000 mg | ORAL_TABLET | Freq: Two times a day (BID) | ORAL | 0 refills | Status: DC

## 2021-05-19 NOTE — Progress Notes (Signed)
Virtual Visit via video Note   Due to COVID-19 pandemic this visit was conducted virtually. This visit type was conducted due to national recommendations for restrictions regarding the COVID-19 Pandemic (e.g. social distancing, sheltering in place) in an effort to limit this patient's exposure and mitigate transmission in our community. All issues noted in this document were discussed and addressed.  A physical exam was not performed with this format.  I connected with  Mason Walker  on 05/19/21 at 9:33 by video and verified that I am speaking with the correct person using two identifiers. Mason Walker is currently located at home and no one  is currently with him during visit. The provider, Mary-Margaret Daphine Deutscher, FNP is located in their office at time of visit.  I discussed the limitations, risks, security and privacy concerns of performing an evaluation and management service by video  and the availability of in person appointments. I also discussed with the patient that there may be a patient responsible charge related to this service. The patient expressed understanding and agreed to proceed.   History and Present Illness:   Chief Complaint: rash and uri  HPI Patient does video c/o nasal congestion cough and congestion. Doc at work put him on amoxicillin and got no better. He finished this 2 weeks ago and is no better. He has been using nasal spray. He now has a fever of 100.5. he also has edema with indentation of right leg with erythema.    Review of Systems  Constitutional:  Positive for chills and fever.  HENT:  Positive for congestion and sinus pain. Negative for ear discharge, ear pain and sore throat.   Respiratory:  Positive for cough and sputum production. Negative for shortness of breath.   Musculoskeletal:  Negative for myalgias.  Neurological:  Positive for headaches.  All other systems reviewed and are negative.     Observations/Objective: Alert and oriented-  answers all questions appropriately No distress Maxillary sinus pressure Right lower ext erythematous and edematous- pitting edema noted   Assessment and Plan: Mason Walker in today with chief complaint of Rash   1. Cellulitis of left lower extremity Elevate leg when sitting - doxycycline (VIBRA-TABS) 100 MG tablet; Take 1 tablet (100 mg total) by mouth 2 (two) times daily. 1 po bid  Dispense: 20 tablet; Refill: 0 - furosemide (LASIX) 20 MG tablet; Take 1 tablet (20 mg total) by mouth daily.  Dispense: 30 tablet; Refill: 3  2. Acute recurrent maxillary sinusitis 1. Take meds as prescribed 2. Use a cool mist humidifier especially during the winter months and when heat has been humid. 3. Use saline nose sprays frequently 4. Saline irrigations of the nose can be very helpful if done frequently.  * 4X daily for 1 week*  * Use of a nettie pot can be helpful with this. Follow directions with this* 5. Drink plenty of fluids 6. Keep thermostat turn down low 7.For any cough or congestion  Use plain Mucinex- regular strength or max strength is fine   * Children- consult with Pharmacist for dosing 8. For fever or aces or pains- take tylenol or ibuprofen appropriate for age and weight.  * for fevers greater than 101 orally you may alternate ibuprofen and tylenol every  3 hours.     - doxycycline (VIBRA-TABS) 100 MG tablet; Take 1 tablet (100 mg total) by mouth 2 (two) times daily. 1 po bid  Dispense: 20 tablet; Refill: 0     Follow Up Instructions: prn  I discussed the assessment and treatment plan with the patient. The patient was provided an opportunity to ask questions and all were answered. The patient agreed with the plan and demonstrated an understanding of the instructions.   The patient was advised to call back or seek an in-person evaluation if the symptoms worsen or if the condition fails to improve as anticipated.  The above assessment and management plan was  discussed with the patient. The patient verbalized understanding of and has agreed to the management plan. Patient is aware to call the clinic if symptoms persist or worsen. Patient is aware when to return to the clinic for a follow-up visit. Patient educated on when it is appropriate to go to the emergency department.   Time call ended:9:46  I provided 13 minutes of face-to-face time during this encounter.    Mary-Margaret Daphine Deutscher, FNP

## 2021-08-14 ENCOUNTER — Other Ambulatory Visit: Payer: Self-pay | Admitting: Nurse Practitioner

## 2021-08-14 DIAGNOSIS — L03116 Cellulitis of left lower limb: Secondary | ICD-10-CM

## 2021-08-18 ENCOUNTER — Encounter: Payer: Self-pay | Admitting: Family Medicine

## 2021-08-18 ENCOUNTER — Other Ambulatory Visit: Payer: Self-pay

## 2021-08-18 ENCOUNTER — Ambulatory Visit (INDEPENDENT_AMBULATORY_CARE_PROVIDER_SITE_OTHER): Payer: Commercial Managed Care - PPO | Admitting: Family Medicine

## 2021-08-18 VITALS — BP 136/88 | HR 98 | Temp 97.5°F | Ht 66.0 in | Wt >= 6400 oz

## 2021-08-18 DIAGNOSIS — F901 Attention-deficit hyperactivity disorder, predominantly hyperactive type: Secondary | ICD-10-CM | POA: Diagnosis not present

## 2021-08-18 DIAGNOSIS — R600 Localized edema: Secondary | ICD-10-CM | POA: Diagnosis not present

## 2021-08-18 DIAGNOSIS — L309 Dermatitis, unspecified: Secondary | ICD-10-CM | POA: Diagnosis not present

## 2021-08-18 LAB — BAYER DCA HB A1C WAIVED: HB A1C (BAYER DCA - WAIVED): 5.1 % (ref 4.8–5.6)

## 2021-08-18 MED ORDER — FUROSEMIDE 40 MG PO TABS: 40.0000 mg | ORAL_TABLET | Freq: Every day | ORAL | 3 refills | Status: DC | PRN

## 2021-08-18 MED ORDER — ATOMOXETINE HCL 40 MG PO CAPS: ORAL_CAPSULE | ORAL | 4 refills | Status: DC

## 2021-08-18 NOTE — Patient Instructions (Signed)
Stasis Dermatitis Stasis dermatitis is a long-term (chronic) skin condition that happens when veins can no longer pump blood back to the heart (poor circulation). This condition causes a red or brown scaly rash or sores (ulcers) from the pooling of blood (stasis). This condition usually affects the lower legs. It may affect one leg or both legs. Without treatment, severe stasis dermatitis can lead to other skin conditions and infections. What are the causes? This condition is caused by poor circulation. What increases the risk? You are more likely to develop this condition if: You are not very active. You stand for long periods of time. You have veins that have become enlarged and twisted (varicose veins). You have leg veins that are not strong enough to send blood back to the heart (venous insufficiency). You have had a blood clot. You have been pregnant many times. You have had vein surgery. You are obese. You have heart or kidney failure. You are 50 years of age or older. You have had injuries to your legs in the past. What are the signs or symptoms? Common early symptoms of this condition include: Itchiness in one or both of your legs. Swelling in your ankle or leg. This might get better overnight but be worse again during the day. Skin that looks thin on your ankle and leg. Red or brown marks that develop slowly. Skin that is dry, cracked, or easily irritated. Red, swollen skin that is sore or has a burning feeling. An achy or heavy feeling after you walk or stand for long periods of time. Pain. Later and more severe symptoms of this condition include: Skin that looks shiny. Small, open sores (ulcers). These are often red or purple and leak fluid. Skin that feels hard. Severe itching. A change in the shape or color of your lower legs. Severe pain. Difficulty walking. How is this diagnosed? This condition may be diagnosed based on: Your symptoms and medical history. A  physical exam. You may also have tests, including: Blood tests. Imaging tests to check blood flow (Doppler ultrasound). Allergy tests. You may need to see a health care provider who specializes in skin diseases (dermatologist). How is this treated? This condition may be treated with: Compression stockings or an elastic wrap to improve circulation. Medicines, such as: Corticosteroid creams and ointments. Non-corticosteroid medicines applied to the skin (topical). Medicine to reduce swelling in the legs (diuretics). Antibiotics. Medicine to relieve itching (antihistamines). A bandage (dressing). A wrap that contains zinc and gelatin (Unna boot). Follow these instructions at home: Skin care Moisturize your skin as told by your health care provider. Do not use moisturizers with fragrance. This can irritate your skin. Apply a cool, wet cloth (cool compress) to the affected areas. Do not scratch your skin. Do not rub your skin dry after a bath or shower. Gently pat your skin dry. Do not use scented soaps, detergents, or perfumes. Medicines Take or use over-the-counter and prescription medicines only as told by your health care provider. If you were prescribed an antibiotic medicine, take or use it as told by your health care provider. Do not stop taking or using the antibiotic even if your condition improves. Activity Walk as told by your health care provider. Walking increases blood flow. Do calf and ankle exercises throughout the day as told by your health care provider. This will help increase blood flow. Raise (elevate) your legs above the level of your heart when you are sitting or lying down. Lifestyle Work with your health   care provider to lose weight, if needed. Do not cross your legs when you sit. Do not stand or sit in one position for long periods of time. Wear comfortable, loose-fitting clothing. Circulation in your legs will be worse if you wear tight pants, belts, and  waistbands. Do not use any products that contain nicotine or tobacco, such as cigarettes, e-cigarettes, and chewing tobacco. If you need help quitting, ask your health care provider. General instructions If you were asked to use one of the following to help with your condition, follow instructions from your health care provider on how to: Remove and change any dressing. Wear compression stockings. These stockings help to prevent blood clots and reduce swelling in your legs. Wear the [employer]. Keep all follow-up visits as told by your health care provider. This is important. Contact a health care provider if: Your condition does not improve with treatment. Your condition gets worse. You have signs of infection in the affected area. Watch for: Swelling. Tenderness. Redness. Soreness. Warmth. You have a fever. Get help right away if: You notice red streaks coming from the affected area. Your bone or joint underneath the affected area becomes painful after the skin has healed. The affected area turns darker. You feel a deep pain in your leg or groin. You are short of breath. Summary Stasis dermatitis is a long-term (chronic) skin condition that happens when veins can no longer pump blood back to the heart (poor circulation). Wear compression stockings as told by your health care provider. These stockings help to prevent blood clots and reduce swelling in your legs. Follow instructions from your health care provider about activity, medicines, and lifestyle. Contact a health care provider if you have a fever or have signs of infection in the affected area. Keep all follow-up visits as told by your health care provider. This is important. This information is not intended to replace advice given to you by your health care provider. Make sure you discuss any questions you have with your health care provider. Document Revised: 12/15/2020 Document Reviewed: 12/15/2020 Elsevier Patient Education   2022 [employer] Edema Edema is when you have too much fluid in your body or under your skin. Edema may make your legs, feet, and ankles swell up. Swelling is also common in looser tissues, like around your eyes. This is a common condition. It gets more common as you get older. There are many possible causes of edema. Eating too much salt (sodium) and being on your feet or sitting for a long time can cause edema in your legs, feet, and ankles. Hot weather may make edema worse. Edema is usually painless. Your skin may look swollen or shiny. Follow these instructions at home: Keep the swollen body part raised (elevated) above the level of your heart when you are sitting or lying down. Do not sit still or stand for a long time. Do not wear tight clothes. Do not wear garters on your upper legs. Exercise your legs. This can help the swelling go down. Wear elastic bandages or support stockings as told by your doctor. Eat a low-salt (low-sodium) diet to reduce fluid as told by your doctor. Depending on the cause of your swelling, you may need to limit how much fluid you drink (fluid restriction). Take over-the-counter and prescription medicines only as told by your doctor. Contact a doctor if: Treatment is not working. You have heart, liver, or kidney disease and have symptoms of edema. You have sudden and unexplained weight gain.  Get help right away if: You have shortness of breath or chest pain. You cannot breathe when you lie down. You have pain, redness, or warmth in the swollen areas. You have heart, liver, or kidney disease and get edema all of a sudden. You have a fever and your symptoms get worse all of a sudden. Summary Edema is when you have too much fluid in your body or under your skin. Edema may make your legs, feet, and ankles swell up. Swelling is also common in looser tissues, like around your eyes. Raise (elevate) the swollen body part above the level of your heart when you  are sitting or lying down. Follow your doctor's instructions about diet and how much fluid you can drink (fluid restriction). This information is not intended to replace advice given to you by your health care provider. Make sure you discuss any questions you have with your health care provider. Document Revised: 07/30/2020 Document Reviewed: 07/30/2020 Elsevier Patient Education  2022 [employer].

## 2021-08-18 NOTE — Progress Notes (Signed)
Subjective: CC: Follow-up ADHD PCP: Janora Norlander, DO [id number] Kozma is a 25 y.o. male presenting to clinic today for:  1.  ADHD Patient reports excellent control of ADHD symptoms with Strattera.  He felt that the Vyvanse caused to crashing in the afternoon or as the Strattera seems to last throughout the afternoon and only weans off at appropriate times prior to bed.  He had no adverse side effects from the medication  2.  Leg discoloration Patient was diagnosed with cellulitis of left lower extremity back in August.  He was treated with Lasix and doxycycline.  He notes that the symptoms really never improved at all with either release them med.  He never experienced any increased urine output with the Lasix either so he abandoned the medication.  He denies any pain, leakage from the skin.  He is trying to increase physical exercise and is working out at the gym several times per week now.  He is try to get in a better state of health for his children.   ROS: Per HPI  No Known Allergies Past Medical History:  Diagnosis Date   Allergy    Asthma    HTN (hypertension)    no meds at this time   Pneumonia 10/2018   Sickle cell trait ([place])    Sleep apnea    c-pap    Current Outpatient Medications:    atomoxetine (STRATTERA) 40 MG capsule, TAKE 1 CAPSULE BY MOUTH EVERY DAY, Disp: 90 capsule, Rfl: 2   furosemide (LASIX) 20 MG tablet, Take 1 tablet (20 mg total) by mouth daily. (Patient not taking: Reported on 08/18/2021), Disp: 30 tablet, Rfl: 3 Social History   Socioeconomic History   Marital status: Single    Spouse name: Not on file   Number of children: Not on file   Years of education: Not on file   Highest education level: Not on file  Occupational History   Occupation: student  Tobacco Use   Smoking status: Never   Smokeless tobacco: Never  Vaping Use   Vaping Use: Never used  Substance and Sexual Activity   Alcohol use: No    Comment: rare   Drug use: No    Sexual activity: Not on file  Other Topics Concern   Not on file  Social History Narrative   Not on file   Social Determinants of Health   Financial Resource Strain: Not on file  Food Insecurity: Not on file  Transportation Needs: Not on file  Physical Activity: Not on file  Stress: Not on file  Social Connections: Not on file  Intimate Partner Violence: Not on file   Family History  Problem Relation Age of Onset   Fibromyalgia Mother    Diabetes Father    Hypertension Father    Hypertension Sister    Hypertension Maternal Grandmother    Hypertension Maternal Grandfather    Alcohol abuse Paternal Grandmother    Heart disease Paternal Grandfather     Objective: Office vital signs reviewed. BP 136/88   Pulse 98   Temp (!) 97.5 F (36.4 C)   Ht '5\' 6"'  (1.676 m)   Wt (!) 415 lb (188.2 kg)   SpO2 94%   BMI 66.98 kg/m   Physical Examination:  General: Awake, alert, morbidly obese, No acute distress Cardio: regular rate and rhythm, S1S2 heard, no murmurs appreciated Pulm: clear to auscultation bilaterally, no wheezes, rhonchi or rales; normal work of breathing on room air Extremities: warm, well perfused,  nonpitting edema of bilateral lower extremities, no cyanosis or clubbing Skin: Hyperpigmented skin noted along the left lower extremity, particularly around the skin folds.  No significant warmth or induration appreciated.  No weeping of the skin.  Assessment/ Plan: 25 y.o. male   Attention deficit hyperactivity disorder (ADHD), predominantly hyperactive type - Plan: atomoxetine (STRATTERA) 40 MG capsule  Bilateral leg edema - Plan: furosemide (LASIX) 40 MG tablet  Dermatitis of lower extremity - Plan: CBC, furosemide (LASIX) 40 MG tablet  Morbid obesity (HCC) - Plan: CMP14+EGFR, LDL Cholesterol, Direct, Bayer DCA Hb A1c Waived, TSH, T4, Free, Insulin, random  ADHD is stable and he is tolerating the Strattera much better than he had Vyvanse.  This has been  renewed  Persistent leg edema and what looks to be stasis dermatitis.  I do not appreciate any infection on exam.  Discussed elevation, consideration for lymphedema clinic but will at least try advancing the dose of Lasix to see if he has a response as that 20 mg did not precipitate urine output  Check for metabolic etiology of symptoms.  Patient is actively working on improving health through exercise  No orders of the defined types were placed in this encounter.  No orders of the defined types were placed in this encounter.    Janora Norlander, DO [place] [phone]

## 2021-08-19 LAB — CBC
Hematocrit: 42 % (ref 37.5–51.0)
Hemoglobin: 14.4 g/dL (ref 13.0–17.7)
MCH: 28.6 pg (ref 26.6–33.0)
MCHC: 34.3 g/dL (ref 31.5–35.7)
MCV: 84 fL (ref 79–97)
Platelets: 251 10*3/uL (ref 150–450)
RBC: 5.03 x10E6/uL (ref 4.14–5.80)
RDW: 12.6 % (ref 11.6–15.4)
WBC: 6.5 10*3/uL (ref 3.4–10.8)

## 2021-08-19 LAB — CMP14+EGFR
ALT: 58 IU/L — ABNORMAL HIGH (ref 0–44)
AST: 40 IU/L (ref 0–40)
Albumin/Globulin Ratio: 1.1 — ABNORMAL LOW (ref 1.2–2.2)
Albumin: 3.2 g/dL — ABNORMAL LOW (ref 4.1–5.2)
Alkaline Phosphatase: 92 IU/L (ref 44–121)
BUN/Creatinine Ratio: 12 (ref 9–20)
BUN: 12 mg/dL (ref 6–20)
Bilirubin Total: 0.3 mg/dL (ref 0.0–1.2)
CO2: 25 mmol/L (ref 20–29)
Calcium: 8.9 mg/dL (ref 8.7–10.2)
Chloride: 106 mmol/L (ref 96–106)
Creatinine, Ser: 0.98 mg/dL (ref 0.76–1.27)
Globulin, Total: 2.9 g/dL (ref 1.5–4.5)
Glucose: 122 mg/dL — ABNORMAL HIGH (ref 70–99)
Potassium: 4.4 mmol/L (ref 3.5–5.2)
Sodium: 142 mmol/L (ref 134–144)
Total Protein: 6.1 g/dL (ref 6.0–8.5)
eGFR: 108 mL/min/{1.73_m2} (ref >59)

## 2021-08-19 LAB — TSH: TSH: 1.87 u[IU]/mL (ref 0.450–4.500)

## 2021-08-19 LAB — INSULIN, RANDOM: INSULIN: 325 u[IU]/mL — ABNORMAL HIGH (ref 2.6–24.9)

## 2021-08-19 LAB — LDL CHOLESTEROL, DIRECT: LDL Direct: 136 mg/dL — ABNORMAL HIGH (ref 0–99)

## 2021-08-19 LAB — T4, FREE: Free T4: 1.18 ng/dL (ref 0.82–1.77)

## 2021-09-24 ENCOUNTER — Encounter: Payer: Self-pay | Admitting: Family Medicine

## 2021-10-26 ENCOUNTER — Telehealth: Payer: Self-pay | Admitting: Family Medicine

## 2021-10-26 NOTE — Telephone Encounter (Signed)
Pt wants to discuss weight loss supplements with Dr Darnell Level. Offered appt but pt states he has to check with his wife and will call back to schedule.

## 2021-10-31 ENCOUNTER — Other Ambulatory Visit: Payer: Self-pay | Admitting: Nurse Practitioner

## 2021-10-31 DIAGNOSIS — L03116 Cellulitis of left lower limb: Secondary | ICD-10-CM

## 2022-02-05 ENCOUNTER — Encounter: Payer: Self-pay | Admitting: Family Medicine

## 2022-02-05 ENCOUNTER — Ambulatory Visit (INDEPENDENT_AMBULATORY_CARE_PROVIDER_SITE_OTHER): Payer: Commercial Managed Care - PPO | Admitting: Family Medicine

## 2022-02-05 VITALS — BP 140/78 | HR 91 | Temp 97.0°F | Ht 66.0 in | Wt >= 6400 oz

## 2022-02-05 DIAGNOSIS — M25521 Pain in right elbow: Secondary | ICD-10-CM

## 2022-02-05 DIAGNOSIS — M25512 Pain in left shoulder: Secondary | ICD-10-CM

## 2022-02-05 MED ORDER — DICLOFENAC SODIUM 75 MG PO TBEC: 75.0000 mg | DELAYED_RELEASE_TABLET | Freq: Two times a day (BID) | ORAL | 1 refills | Status: DC

## 2022-02-05 NOTE — Progress Notes (Signed)
? ?  Acute Office Visit ? ?Subjective:  ? ?  ?Patient ID: Mason Walker, male    DOB: 1996/06/29, 26 y.o.   MRN: [id number] ? ?Chief Complaint  ?Patient presents with  ? Shoulder Pain  ? ? ?HPI ?Patient is in today for left shoulder pain and right elbow pain x 2 months. Both started when increasing his intensity of work outs at [employer] Did not have an injury however.  ? ?Left shoulder pain ?Reports pain in this shoulder for 2 months. Pain is mild, intermittent, and achy. Does not radiate. Worse with movement.  He has taken ibuprofen as needed. Denies numbness or tingling. Denies warmth, swelling, or erythema.  Reports crepitus and slight decreased ROM when lifting arm overhead. Reports injury in 2019 to shoulder. Had completed PT in past and had injection in past. Injection did help with his pain previously and he is interested in this.  ? ?2. Right elbow pain ?Reports right elbow pain on the lateral aspect. Pain is intermittent and mild. Does not radiate. Worse with certain movements such as passing a cup to his wife. Denies numbness or tingling. Denies warmth, swelling, or erythema.  ? ? ?ROS ?As per HPI.  ? ?   ?Objective:  ?  ?BP 140/78   Pulse 91   Temp (!) 97 ?F (36.1 ?C) (Temporal)   Ht 5\' 6"  (1.676 m)   Wt (!) 407 lb 8 oz (184.8 kg)   BMI 65.77 kg/m?  ? ? ?Physical Exam ?Vitals and nursing note reviewed.  ?Constitutional:   ?   General: He is not in acute distress. ?   Appearance: He is not ill-appearing, toxic-appearing or diaphoretic.  ?Musculoskeletal:  ?   Left shoulder: Tenderness present. No swelling, deformity, effusion or bony tenderness. Decreased range of motion (slight decrease with overhead movement). Normal strength.  ?   Right elbow: No swelling, deformity or effusion. Normal range of motion. Tenderness present in lateral epicondyle.  ?   Right lower leg: No edema.  ?   Left lower leg: No edema.  ?Skin: ?   General: Skin is warm and dry.  ?Neurological:  ?   Mental Status: He is alert and  oriented to person, place, and time.  ?Psychiatric:     ?   Mood and Affect: Mood normal.     ?   Behavior: Behavior normal.  ? ? ?No results found for any visits on 02/05/22. ? ? ?   ?Assessment & Plan:  ? ?Mason Walker was seen today for shoulder pain. ? ?Diagnoses and all orders for this visit: ? ?Acute pain of left shoulder ?? [facility] pathology. Declined xray, PT, and ortho referral today. Voltaren ordered. Do not take other NSAIDs with voltaren. RICE therapy.  ?-     diclofenac (VOLTAREN) 75 MG EC tablet; Take 1 tablet (75 mg total) by mouth 2 (two) times daily. ? ?Right elbow pain ?Consitent with lateral epicondylitis. Voltaren ordered, do not take other NSAIDs. RICE therapy. Declined PT or ortho referral today.  ?-     diclofenac (VOLTAREN) 75 MG EC tablet; Take 1 tablet (75 mg total) by mouth 2 (two) times daily. ? ? ?Return for schedule with provider who can do elbow and shoulder injections. ? ?The patient indicates understanding of these issues and agrees with the plan. ? ?Gerilyn Pilgrim, FNP ? ? ?

## 2022-02-05 NOTE — Patient Instructions (Signed)

## 2022-03-03 ENCOUNTER — Ambulatory Visit (INDEPENDENT_AMBULATORY_CARE_PROVIDER_SITE_OTHER): Payer: Commercial Managed Care - PPO | Admitting: Family Medicine

## 2022-03-03 ENCOUNTER — Encounter: Payer: Self-pay | Admitting: Family Medicine

## 2022-03-03 VITALS — BP 121/74 | HR 94 | Temp 97.4°F | Ht 66.0 in | Wt >= 6400 oz

## 2022-03-03 DIAGNOSIS — M25512 Pain in left shoulder: Secondary | ICD-10-CM

## 2022-03-03 DIAGNOSIS — H60332 Swimmer's ear, left ear: Secondary | ICD-10-CM

## 2022-03-03 DIAGNOSIS — M7711 Lateral epicondylitis, right elbow: Secondary | ICD-10-CM | POA: Diagnosis not present

## 2022-03-03 DIAGNOSIS — I890 Lymphedema, not elsewhere classified: Secondary | ICD-10-CM

## 2022-03-03 DIAGNOSIS — G8929 Other chronic pain: Secondary | ICD-10-CM

## 2022-03-03 MED ORDER — CEFDINIR 300 MG PO CAPS: 300.0000 mg | ORAL_CAPSULE | Freq: Two times a day (BID) | ORAL | 0 refills | Status: DC

## 2022-03-03 MED ORDER — CIPROFLOXACIN-DEXAMETHASONE 0.3-0.1 % OT SUSP: 4.0000 [drp] | Freq: Two times a day (BID) | OTIC | 0 refills | Status: AC

## 2022-03-03 NOTE — Progress Notes (Signed)
? ?Subjective: ?CC: Lymphedema ?PCP: Janora Norlander, DO ?[id number] Mason Walker is a 26 y.o. male presenting to clinic today for: ? ?1.  Lymphedema ?Patient was last seen for lymphedema back in November of last year.  He reports ongoing leg swelling and what seems to be bandlike fluid collections on the lower extremity.  He does not report any leaking of the extremities but did have a bout of cellulitis in the last year.  Wanting referral to vascular for further evaluation.  Identifies his weight as part of the problem but so far symptoms have been refractory to Lasix ? ?2.  Elbow pain ?Patient reports some right-sided elbow discomfort.  He notices difficulty lifting and extending his right upper extremity.  He points to the lateral aspect of the elbow as the area of discomfort but does not report any significant swelling.  Symptoms are refractory to use of Voltaren.  He has modified his activities so as not to exacerbate issues and in fact does not lifting weights anymore because of this ? ?3.  Cold ?Patient reports cold symptoms that been present for the last 2 weeks.  He reports his ears being stopped up, having chills but no measured fevers.  He has not tested for COVID.  He denies any hemoptysis or brown sputum.  No purulence from nares.  Using nasal spray at home but no oral allergy medications.  Reports some discomfort in his ears and decreased hearing  ? ? ?ROS: Per HPI ? ?No Known Allergies ?Past Medical History:  ?Diagnosis Date  ? Allergy   ? Asthma   ? HTN (hypertension)   ? no meds at this time  ? Pneumonia 10/2018  ? Sickle cell trait ([place])   ? Sleep apnea   ? c-pap  ? ? ?Current Outpatient Medications:  ?  atomoxetine (STRATTERA) 40 MG capsule, TAKE 1 CAPSULE BY MOUTH EVERY DAY, Disp: 90 capsule, Rfl: 4 ?  diclofenac (VOLTAREN) 75 MG EC tablet, Take 1 tablet (75 mg total) by mouth 2 (two) times daily., Disp: 60 tablet, Rfl: 1 ?  furosemide (LASIX) 40 MG tablet, Take 1 tablet (40 mg total) by mouth  daily as needed for edema or fluid., Disp: 90 tablet, Rfl: 3 ?Social History  ? ?Socioeconomic History  ? Marital status: Single  ?  Spouse name: Not on file  ? Number of children: Not on file  ? Years of education: Not on file  ? Highest education level: Not on file  ?Occupational History  ? Occupation: student  ?Tobacco Use  ? Smoking status: Never  ? Smokeless tobacco: Never  ?Vaping Use  ? Vaping Use: Never used  ?Substance and Sexual Activity  ? Alcohol use: No  ?  Comment: rare  ? Drug use: No  ? Sexual activity: Not on file  ?Other Topics Concern  ? Not on file  ?Social History Narrative  ? Not on file  ? ?Social Determinants of Health  ? ?Financial Resource Strain: Not on file  ?Food Insecurity: Not on file  ?Transportation Needs: Not on file  ?Physical Activity: Not on file  ?Stress: Not on file  ?Social Connections: Not on file  ?Intimate Partner Violence: Not on file  ? ?Family History  ?Problem Relation Age of Onset  ? Fibromyalgia Mother   ? Diabetes Father   ? Hypertension Father   ? Hypertension Sister   ? Hypertension Maternal Grandmother   ? Hypertension Maternal Grandfather   ? Alcohol abuse Paternal Grandmother   ?  Heart disease Paternal Grandfather   ? ? ?Objective: ?Office vital signs reviewed. ?BP 121/74   Pulse 94   Temp (!) 97.4 ?F (36.3 ?C)   Ht 5\' 6"  (1.676 m)   Wt (!) 413 lb 9.6 oz (187.6 kg)   SpO2 94%   BMI 66.76 kg/m?  ? ?Physical Examination:  ?General: Awake, alert, morbidly obese, No acute distress ?HEENT: Normal ?   Ears: Tympanic membranes intact, dulled left-sided light reflex, mild erythema of the external auditory canal, no bulging ?   Nose: nasal turbinates moist, clear nasal discharge ?   Throat: moist mucus membranes, no erythema, no tonsillar exudate.  Airway is patent ?Cardio: regular rate and rhythm, S1S2 heard, no murmurs appreciated ?Pulm: clear to auscultation bilaterally, no wheezes, rhonchi or rales; normal work of breathing on room air ?Extremities: Pitting  edema appreciated to the lower extremities.  He has lymphedema present throughout.  No skin breakdown but hyperpigmentation appreciated.  Folds of the lower extremities ?MSK:  ? Right arm/elbow: No Popeye deformity appreciated.  He has full active range of motion with exception about a 10 degree loss of extension.  He has visible difficulty lifting an object in office.  He has no appreciable joint erythema, swelling or warmth.  There is minimal tenderness over the lateral epicondyles and certainly no fluid collection on the olecranon process to suggest bursitis ? ?Assessment/ Plan: ?26 y.o. male  ? ?Lymphedema of both lower extremities - Plan: Ambulatory referral to Vascular Surgery ? ?Acute swimmer's ear of left side - Plan: ciprofloxacin-dexamethasone (CIPRODEX) OTIC suspension, cefdinir (OMNICEF) 300 MG capsule ? ?Lateral epicondylitis of right elbow ? ?Chronic left shoulder pain ? ?Referral to vascular placed.  Again we did discuss that weight loss would be helpful with the lower extremity edema.  May consider referral to lymphedema clinic but will await vascular surgery's recommendations ? ?Certainly looks like he has an acute otitis externa on the left.  I have given him a pocket prescription for Omnicef to use if symptoms worsen.  We discussed indications for use but otherwise would treat his symptoms like a cold. ? ?Suspect lateral epicondylitis of that right elbow.  Symptoms are refractory to conservative treatment.  We discussed icing the affected area.  We will arrange an appointment with our orthopedist here in office.  He also has some chronic left shoulder pain and since this is Dr Amedeo Kinsman' area of expertise I went ahead and indicated that he may want to discuss that during that visit as well ? ?No orders of the defined types were placed in this encounter. ? ?No orders of the defined types were placed in this encounter. ? ? ? ?Janora Norlander, DO ?[place] ?([phone] ? ?

## 2022-03-29 ENCOUNTER — Encounter: Payer: Self-pay | Admitting: Orthopedic Surgery

## 2022-03-29 ENCOUNTER — Ambulatory Visit (INDEPENDENT_AMBULATORY_CARE_PROVIDER_SITE_OTHER): Payer: Commercial Managed Care - PPO | Admitting: Orthopedic Surgery

## 2022-03-29 DIAGNOSIS — M79631 Pain in right forearm: Secondary | ICD-10-CM

## 2022-03-29 DIAGNOSIS — G8929 Other chronic pain: Secondary | ICD-10-CM

## 2022-03-29 DIAGNOSIS — M25512 Pain in left shoulder: Secondary | ICD-10-CM

## 2022-03-29 NOTE — Patient Instructions (Signed)

## 2022-03-29 NOTE — Progress Notes (Signed)
New Patient Visit  Assessment: Mason Walker is a 26 y.o. male with the following: 1. Pain of right forearm 2. Chronic left shoulder pain  Plan: Mason Walker has multiple complaints.  He has pain in the proximal right forearm, just distal to the lateral epicondyle.  Was previously treated for tennis elbow with limited improvement.  Pain could be related to PIN compression, but he has good strength in the right hand.  He can continue with his preferred activities, but should monitor his symptoms and if they get worse, we can work this up further.  As for the left shoulder, he has pain with certain motions.  Pain is not the same as the right shoulder that required surgery.  He has had good relief from a previous injection and would like to proceed again.  No issues with the injection.  Home exercises provided. Follow up as needed.    Procedure note injection Left shoulder    Verbal consent was obtained to inject the left shoulder, subacromial space Timeout was completed to confirm the site of injection.  The skin was prepped with alcohol and ethyl chloride was sprayed at the injection site.  A 21-gauge needle was used to inject 40 mg of Depo-Medrol and 1% lidocaine (3 cc) into the subacromial space of the left shoulder using a posterolateral approach.  There were no complications. A sterile bandage was applied.   Follow-up: Return if symptoms worsen or fail to improve.  Subjective:  Chief Complaint  Patient presents with   Elbow Pain    Right elbow pain, 3-4 months, lateral epicondyle pain, tried NSAIDS, heat/ice, compression.    Shoulder Pain    Left shld pain, 3-4 months when working out, had a cortisone inj 2020.  Has FROM, just discomfort and pain, cannot work out.     History of Present Illness: Mason Walker is a 26 y.o. male who has been referred by  Delynn Flavin, DO for evaluation of left shoulder and right elbow pain.  Both complaints have been ongoing for 3-4  months.  No specific injury.  NSAIDs, bracing and activity modifications have not been helpful.  Right arm pain is just distal to the elbow.  No pain over the lateral elbow.  It limits his ability to work out.  His biggest concern is whether or not he can do more damage if he continues to use the arm as he would like.  As for the left shoulder, it is also been bothering him for few months.  He has had issues in the left shoulder in the past.  He has also had surgery on the right shoulder, but states his current symptoms are not as bad as they were in the right shoulder.  He had an injection a couple years ago in the left shoulder, with excellent relief.  More recently, his pain has not responded to NSAIDs, ice or activity modifications.  He feels as though he has good range of motion.  His strength is good.   Review of Systems: No fevers or chills No numbness or tingling No chest pain No shortness of breath No bowel or bladder dysfunction No GI distress No headaches   Medical History:  Past Medical History:  Diagnosis Date   Allergy    Asthma    HTN (hypertension)    no meds at this time   Pneumonia 10/2018   Sickle cell trait (HCC)    Sleep apnea    c-pap    Past Surgical History:  Procedure Laterality Date   FRACTURE SURGERY Left    age 43 and age 26   SHOULDER ARTHROSCOPY Right 08/14/2019   Procedure: RIGHT ACROMIOPLASTY, DISTAL CLAVU;ECTOMY AND DEBRIDEMENT;  Surgeon: Melrose Nakayama, MD;  Location: [facility];  Service: Orthopedics;  Laterality: Right;   TONSILLECTOMY AND ADENOIDECTOMY      Family History  Problem Relation Age of Onset   Fibromyalgia Mother    Diabetes Father    Hypertension Father    Hypertension Sister    Hypertension Maternal Grandmother    Hypertension Maternal Grandfather    Alcohol abuse Paternal Grandmother    Heart disease Paternal Grandfather    Social History   Tobacco Use   Smoking status: Never   Smokeless tobacco: Never  Vaping Use    Vaping Use: Never used  Substance Use Topics   Alcohol use: No    Comment: rare   Drug use: No    No Known Allergies  Current Meds  Medication Sig   atomoxetine (STRATTERA) 40 MG capsule TAKE 1 CAPSULE BY MOUTH EVERY DAY   cefdinir (OMNICEF) 300 MG capsule Take 1 capsule (300 mg total) by mouth 2 (two) times daily. 1 po BID   diclofenac (VOLTAREN) 75 MG EC tablet Take 1 tablet (75 mg total) by mouth 2 (two) times daily.   furosemide (LASIX) 40 MG tablet Take 1 tablet (40 mg total) by mouth daily as needed for edema or fluid.    Objective: There were no vitals taken for this visit.  Physical Exam:  General: Alert and oriented., No acute distress., and Obese male.  Gait: Normal gait.  Right arm without deformity.  No bruising.  No swelling.  No tenderness to palpation within the antecubital fossa.  Biceps tendon is palpated within the fossa.  No tenderness to palpation over the lateral epicondyle.  No pain with resisted wrist extension.  No pain with resisted long finger extension.  Fingers are warm and well-perfused.  Grip strength is 5/5.  Active motion intact in the AIN/PIN/U nerve distribution.  Left shoulder without deformity.  He has excellent range of motion of his shoulder.  Strength is 5/5 in the deltoid, supraspinatus, infraspinatus and subscapularis.  Fingers are warm and well-perfused.  Sensation is intact throughout the left hand.  IMAGING: No new imaging obtained today   New Medications:  No orders of the defined types were placed in this encounter.     Mordecai Rasmussen, MD  03/29/2022 10:36 PM

## 2022-04-10 ENCOUNTER — Other Ambulatory Visit: Payer: Self-pay

## 2022-04-10 DIAGNOSIS — I890 Lymphedema, not elsewhere classified: Secondary | ICD-10-CM

## 2022-05-07 ENCOUNTER — Ambulatory Visit (HOSPITAL_COMMUNITY)
Admission: RE | Admit: 2022-05-07 | Discharge: 2022-05-07 | Disposition: A | Discharge status: Home / Self Care | Payer: Commercial Managed Care - PPO | Source: Ambulatory Visit | Attending: Vascular Surgery | Admitting: Vascular Surgery

## 2022-05-07 DIAGNOSIS — I890 Lymphedema, not elsewhere classified: Secondary | ICD-10-CM | POA: Insufficient documentation

## 2022-05-13 ENCOUNTER — Ambulatory Visit (INDEPENDENT_AMBULATORY_CARE_PROVIDER_SITE_OTHER): Payer: Commercial Managed Care - PPO | Admitting: Physician Assistant

## 2022-05-13 ENCOUNTER — Encounter: Payer: Self-pay | Admitting: Vascular Surgery

## 2022-05-13 VITALS — BP 135/82 | HR 96 | Temp 98.1°F | Resp 16 | Ht 66.0 in | Wt >= 6400 oz

## 2022-05-13 DIAGNOSIS — I890 Lymphedema, not elsewhere classified: Secondary | ICD-10-CM | POA: Diagnosis not present

## 2022-05-13 DIAGNOSIS — I872 Venous insufficiency (chronic) (peripheral): Secondary | ICD-10-CM | POA: Diagnosis not present

## 2022-05-13 NOTE — Progress Notes (Addendum)
Office Note     CC:  follow up Requesting Provider:  Raliegh Ip, DO  HPI: Mason Walker is a 26 y.o. ([birth year]) male who presents for evaluation of lymphedema affecting bilateral lower extremities however left more than the right.  Past medical history significant for hypertension, sleep apnea, sickle cell trait, asthma, and morbid obesity.  He denies any history of DVT, venous ulcerations, trauma, or prior vascular interventions.  He states that he has been gaining weight recently since the birth of his son 5 months ago despite exercise.  Lymphedema of BLE is bothersome despite compression with Ace wraps.  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  He elevates his legs when he gets home at night.  He states his legs look much better when he wakes up in the morning.  He has been started on Lasix by his PCP.  He denies tobacco use.  He has never been diagnosed with cellulitis.   Past Medical History:  Diagnosis Date   Allergy    Asthma    HTN (hypertension)    no meds at this time   Pneumonia 10/2018   Sickle cell trait (HCC)    Sleep apnea    c-pap    Past Surgical History:  Procedure Laterality Date   FRACTURE SURGERY Left    age 24 and age 4   SHOULDER ARTHROSCOPY Right 08/14/2019   Procedure: RIGHT ACROMIOPLASTY, DISTAL CLAVU;ECTOMY AND DEBRIDEMENT;  Surgeon: Marcene Corning, MD;  Location: [facility];  Service: Orthopedics;  Laterality: Right;   TONSILLECTOMY AND ADENOIDECTOMY      Social History   Socioeconomic History   Marital status: Single    Spouse name: Not on file   Number of children: Not on file   Years of education: Not on file   Highest education level: Not on file  Occupational History   Occupation: student  Tobacco Use   Smoking status: Never   Smokeless tobacco: Never  Vaping Use   Vaping Use: Never used  Substance and Sexual Activity   Alcohol use: No    Comment: rare   Drug use: No   Sexual activity: Not on  file  Other Topics Concern   Not on file  Social History Narrative   Not on file   Social Determinants of Health   Financial Resource Strain: Not on file  Food Insecurity: Not on file  Transportation Needs: Not on file  Physical Activity: Not on file  Stress: Not on file  Social Connections: Not on file  Intimate Partner Violence: Not on file    Family History  Problem Relation Age of Onset   Fibromyalgia Mother    Diabetes Father    Hypertension Father    Hypertension Sister    Hypertension Maternal Grandmother    Hypertension Maternal Grandfather    Alcohol abuse Paternal Grandmother    Heart disease Paternal Grandfather     Current Outpatient Medications  Medication Sig Dispense Refill   diclofenac (VOLTAREN) 75 MG EC tablet Take 1 tablet (75 mg total) by mouth 2 (two) times daily. 60 tablet 1   atomoxetine (STRATTERA) 40 MG capsule TAKE 1 CAPSULE BY MOUTH EVERY DAY (Patient not taking: Reported on 05/13/2022) 90 capsule 4   cefdinir (OMNICEF) 300 MG capsule Take 1 capsule (300 mg total) by mouth 2 (two) times daily. 1 po BID (Patient not taking: Reported on 05/13/2022) 20 capsule 0   furosemide (LASIX) 40 MG  tablet Take 1 tablet (40 mg total) by mouth daily as needed for edema or fluid. (Patient not taking: Reported on 05/13/2022) 90 tablet 3   No current facility-administered medications for this visit.    No Known Allergies   REVIEW OF SYSTEMS:   [X]  denotes positive finding, [ ]  denotes negative finding Cardiac  Comments:  Chest pain or chest pressure:    Shortness of breath upon exertion:    Short of breath when lying flat:    Irregular heart rhythm:        Vascular    Pain in calf, thigh, or hip brought on by ambulation:    Pain in feet at night that wakes you up from your sleep:     Blood clot in your veins:    Leg swelling:         Pulmonary    Oxygen at home:    Productive cough:     Wheezing:         Neurologic    Sudden weakness in arms or  legs:     Sudden numbness in arms or legs:     Sudden onset of difficulty speaking or slurred speech:    Temporary loss of vision in one eye:     Problems with dizziness:         Gastrointestinal    Blood in stool:     Vomited blood:         Genitourinary    Burning when urinating:     Blood in urine:        Psychiatric    Major depression:         Hematologic    Bleeding problems:    Problems with blood clotting too easily:        Skin    Rashes or ulcers:        Constitutional    Fever or chills:      PHYSICAL EXAMINATION:  Vitals:   05/13/22 0849  BP: 135/82  Pulse: 96  Resp: 16  Temp: 98.1 F (36.7 C)  TempSrc: Temporal  SpO2: 96%  Weight: (!) 412 lb (186.9 kg)  Height: 5\' 6"  (1.676 m)    General:  WDWN in NAD; vital signs documented above Gait: Not observed HENT: WNL, normocephalic Pulmonary: normal non-labored breathing , without Rales, rhonchi,  wheezing Cardiac: regular HR Abdomen: soft, NT, no masses Skin: without rashes Vascular Exam/Pulses:  Right Left  Radial 2+ (normal) 2+ (normal)  DP 2+ (normal) 2+ (normal)   Extremities: Lymphedema affecting bilateral lower extremities left larger than right; surface spider veins bilaterally, hyperpigmentation and beginning hyperplasia of left medial lower leg; left calf 62 cm, ankle 32 cm, foot 20 cm; right calf 57-1/2 cm, ankle 32-1/2 cm, foot 20 cm Musculoskeletal: no muscle wasting or atrophy  Neurologic: A&O X 3;  No focal weakness or paresthesias are detected Psychiatric:  The pt has Normal affect.   Non-Invasive Vascular Imaging:   Left lower extremity venous reflux study negative for DVT; incompetent left greater saphenous vein from the saphenofemoral junction to the knee with a diameter greater than 4 mm throughout the thigh    ASSESSMENT/PLAN:: 26 y.o. male here for evaluation of bilateral lower extremity edema with the left leg being more symptomatic  -Based on physical exam, patient  appears to have lymphedema.  Left lower extremity venous reflux study also demonstrates venous insufficiency.  Study was negative for DVT however did show an incompetent left greater saphenous vein.  However given patient's weight, he would not be a candidate for laser ablation at this time.  Recommendations included weight loss, compression stockings or CircAid wraps to be used regularly, as well as elevation above the level of the heart which was demonstrated.  However, I do believe he would be a good candidate for lymphedema pumps given ongoing symptoms despite compression, elevation, and exercise for greater than 1 month.  We will submit our recommendations to bio tab.  In the event that he experience significant weight loss and still had symptomatic edema of the left leg we could consider laser ablation therapy at that time.  For now he will follow-up as needed.   Emilie Rutter, PA-C [facility] [phone]  Clinic MD:   Edilia Bo

## 2022-08-25 ENCOUNTER — Other Ambulatory Visit: Payer: Self-pay | Admitting: Family Medicine

## 2022-08-25 DIAGNOSIS — F901 Attention-deficit hyperactivity disorder, predominantly hyperactive type: Secondary | ICD-10-CM

## 2022-09-15 ENCOUNTER — Ambulatory Visit (INDEPENDENT_AMBULATORY_CARE_PROVIDER_SITE_OTHER): Payer: Commercial Managed Care - PPO | Admitting: Family Medicine

## 2022-09-15 ENCOUNTER — Encounter: Payer: Self-pay | Admitting: Family Medicine

## 2022-09-15 VITALS — BP 159/75 | HR 79 | Temp 98.2°F | Ht 66.0 in | Wt >= 6400 oz

## 2022-09-15 DIAGNOSIS — M79671 Pain in right foot: Secondary | ICD-10-CM

## 2022-09-15 DIAGNOSIS — F411 Generalized anxiety disorder: Secondary | ICD-10-CM | POA: Diagnosis not present

## 2022-09-15 DIAGNOSIS — G4733 Obstructive sleep apnea (adult) (pediatric): Secondary | ICD-10-CM

## 2022-09-15 DIAGNOSIS — Z6841 Body Mass Index (BMI) 40.0 and over, adult: Secondary | ICD-10-CM

## 2022-09-15 DIAGNOSIS — M79672 Pain in left foot: Secondary | ICD-10-CM

## 2022-09-15 DIAGNOSIS — H6993 Unspecified Eustachian tube disorder, bilateral: Secondary | ICD-10-CM

## 2022-09-15 DIAGNOSIS — H6592 Unspecified nonsuppurative otitis media, left ear: Secondary | ICD-10-CM

## 2022-09-15 DIAGNOSIS — F901 Attention-deficit hyperactivity disorder, predominantly hyperactive type: Secondary | ICD-10-CM

## 2022-09-15 DIAGNOSIS — R7989 Other specified abnormal findings of blood chemistry: Secondary | ICD-10-CM

## 2022-09-15 DIAGNOSIS — I890 Lymphedema, not elsewhere classified: Secondary | ICD-10-CM

## 2022-09-15 MED ORDER — ESCITALOPRAM OXALATE 10 MG PO TABS: 10.0000 mg | ORAL_TABLET | Freq: Every day | ORAL | 1 refills | Status: DC

## 2022-09-15 MED ORDER — DICLOFENAC SODIUM 75 MG PO TBEC: 75.0000 mg | DELAYED_RELEASE_TABLET | Freq: Two times a day (BID) | ORAL | 1 refills | Status: DC

## 2022-09-15 MED ORDER — PREDNISONE 10 MG (21) PO TBPK: ORAL_TABLET | ORAL | 0 refills | Status: DC

## 2022-09-15 MED ORDER — ATOMOXETINE HCL 60 MG PO CAPS: ORAL_CAPSULE | ORAL | 3 refills | Status: DC

## 2022-09-15 NOTE — Patient Instructions (Addendum)
Check with insurance to see if Wegovy or Zepbound will be covered for weight loss  Here is a guide to help [place] find out which weight loss medications will be covered by your insurance plan.  Please check out this web site  [employer] and follow the 3 simple steps.   There is also a phone number you can call if you do not have access to the Internet. [phone] (Monday- Friday 8am-8pm)  [employer] provides coverage information for more than 80% of the inquiries submitted!!   Taking the medicine as directed and not missing any doses is one of the best things you can do to treat your anxiety/depression.  Here are some things to keep in mind:  Side effects (stomach upset, some increased anxiety) may happen before you notice a benefit.  These side effects typically go away over time. Changes to your dose of medicine or a change in medication all together is sometimes necessary Most people need to be on medication at least 12 months Many people will notice an improvement within two weeks but the full effect of the medication can take up to 4-6 weeks Stopping the medication when you start feeling better often results in a return of symptoms Never discontinue your medication without contacting a health care professional first.  Some medications require gradual discontinuation/ taper and can make you sick if you stop them abruptly.  If your symptoms worsen or you have thoughts of suicide/homicide, PLEASE SEEK IMMEDIATE MEDICAL ATTENTION.  You may always call:  National Suicide Hotline: 4436066999 [place] Crisis Line: 207-802-6556 Crisis Recovery in [place]: [phone]   These are available 24 hours a day, 7 days a week.

## 2022-09-15 NOTE — Progress Notes (Signed)
Subjective: CC: ADHD.  Patient actually voices multiple concerns PCP: Janora Norlander, DO [id number] Batterman is a 26 y.o. male presenting to clinic today for:  1.  ADHD Patient reports that anxiety seems to be worse lately.  He thinks this is multifactorial from work and from financial stressors.  He is taking Strattera 40 mg for ADHD and that has been working relatively well up until recently.  Would like to increase that dose and perhaps going something daily for anxiety.  Has never been treated for anxiety or depression in the past but notes that he sometimes has intrusive thoughts.  2.  Ear pain Patient reports bilateral ear fullness.  He is having some drainage from the left ear.  No fevers.  No dizziness.  Has had recurrent ear issues over the years and notes that many family members have required tympanostomy tubes.  He may be interested in seeing an ENT but notes that financially this is not feasible at this time  3.  Morbid obesity associated with obstructive sleep apnea, lymphedema Patient saw the vascular specialist and he thought he was a great candidate for lymphatic pumps but unfortunately insurance did not cover.  Uncertain as to whether or not these will be appealed.  Additionally, he needs a note stating that he has obstructive sleep apnea so that he can get new CPAP machine/supplies from local supplier.  He has suffered from this for years.  He is interested in medical weight loss as well.  Not sure if insurance will cover anything but would be interested in starting something if able.   ROS: Per HPI  No Known Allergies Past Medical History:  Diagnosis Date   Allergy    Asthma    HTN (hypertension)    no meds at this time   Pneumonia 10/2018   Sickle cell trait ([place])    Sleep apnea    c-pap    Current Outpatient Medications:    atomoxetine (STRATTERA) 40 MG capsule, TAKE 1 CAPSULE BY MOUTH EVERY DAY, Disp: 90 capsule, Rfl: 4   diclofenac (VOLTAREN) 75 MG EC  tablet, Take 1 tablet (75 mg total) by mouth 2 (two) times daily., Disp: 60 tablet, Rfl: 1   furosemide (LASIX) 40 MG tablet, Take 1 tablet (40 mg total) by mouth daily as needed for edema or fluid. (Patient not taking: Reported on 05/13/2022), Disp: 90 tablet, Rfl: 3 Social History   Socioeconomic History   Marital status: Single    Spouse name: Not on file   Number of children: Not on file   Years of education: Not on file   Highest education level: Not on file  Occupational History   Occupation: student  Tobacco Use   Smoking status: Never   Smokeless tobacco: Never  Vaping Use   Vaping Use: Never used  Substance and Sexual Activity   Alcohol use: No    Comment: rare   Drug use: No   Sexual activity: Not on file  Other Topics Concern   Not on file  Social History Narrative   Not on file   Social Determinants of Health   Financial Resource Strain: Not on file  Food Insecurity: Not on file  Transportation Needs: Not on file  Physical Activity: Not on file  Stress: Not on file  Social Connections: Not on file  Intimate Partner Violence: Not on file   Family History  Problem Relation Age of Onset   Fibromyalgia Mother    Diabetes Father  Hypertension Father    Hypertension Sister    Hypertension Maternal Grandmother    Hypertension Maternal Grandfather    Alcohol abuse Paternal Grandmother    Heart disease Paternal Grandfather     Objective: Office vital signs reviewed. BP (!) 159/75   Pulse 79   Temp 98.2 F (36.8 C)   Ht _0  (1.676 m)   Wt (!) 418 lb 12.8 oz (190 kg)   SpO2 95%   BMI 67.60 kg/m   Physical Examination:  General: Awake, alert, super morbidly obese, No acute distress HEENT: Clear effusion noted of the left TM.  No erythema or purulence Cardio: regular rate and rhythm, S1S2 heard, no murmurs appreciated Pulm: Distant breath sounds extremely clear to auscultation without wheezes, rhonchi or rales.  Normal work of breathing on room  air Extremities: Lymphedema was not bilaterally MSK: Ambulating independently with wide-based gait and station Assessment/ Plan: 26 y.o. male   Attention deficit hyperactivity disorder (ADHD), predominantly hyperactive type - Plan: CMP14+EGFR, TSH, atomoxetine (STRATTERA) 60 MG capsule  Middle ear effusion, left - Plan: predniSONE (STERAPRED UNI-PAK 21 TAB) 10 MG (21) TBPK tablet  Eustachian tube dysfunction, bilateral  Generalized anxiety disorder - Plan: escitalopram (LEXAPRO) 10 MG tablet  Bilateral foot pain - Plan: diclofenac (VOLTAREN) 75 MG EC tablet  Morbid obesity with BMI of 50.0-59.9, adult ([place]) - Plan: CMP14+EGFR, CBC, LDL Cholesterol, Direct, Bayer DCA Hb A1c Waived, TSH  Lymphedema of both lower extremities - Plan: CMP14+EGFR, CBC  OSA on CPAP  Elevated liver function tests - Plan: CMP14+EGFR, Hepatitis C antibody  ADHD not well controlled.  Increase Strattera to 60 mg daily.  Check CMP TSH  Suspect that he has ear effusion secondary to eustachian tube dysfunction.  Interested in ENT but financially cannot see them at this time.  Prednisone Dosepak provided.  Antihistamines and nasal sprays recommended  Anxiety disorder secondary to work life and home life.  Start Lexapro 10 mg daily.  We will reassess in 2 months, sooner if concerns arise  Caution SSRI use with Voltaren.  Use sparingly for feet pain.  Discussed that this could exacerbate edema  Morbidly obese and interested in weight loss medication.  Interested in [place] if eligible through insurance.  Continues to struggle with lymphedema and has been wrapping his legs as his lymphedema pumps were not covered by insurance.  Unsure if this is being appealed by vascular  Suffers from OSA and is try to get new CPAP machine through [place].  Needs a note stating that he has obstructive sleep apnea  Liver function tests were elevated on last panel.  Likely secondary to obesity, cholesterol.  We will check  screening hepatitis C and repeat LFTs.  Labs today are nonfasting  Orders Placed This Encounter  Procedures   CMP14+EGFR   CBC   LDL Cholesterol, Direct   Bayer DCA Hb A1c Waived   TSH   Hepatitis C antibody   No orders of the defined types were placed in this encounter.    Janora Norlander, DO [place] [phone]

## 2022-11-09 ENCOUNTER — Ambulatory Visit (INDEPENDENT_AMBULATORY_CARE_PROVIDER_SITE_OTHER): Payer: Commercial Managed Care - PPO | Admitting: Family Medicine

## 2022-11-09 ENCOUNTER — Encounter: Payer: Self-pay | Admitting: Family Medicine

## 2022-11-09 VITALS — BP 137/82 | HR 91 | Temp 98.5°F | Ht 66.0 in | Wt >= 6400 oz

## 2022-11-09 DIAGNOSIS — G4733 Obstructive sleep apnea (adult) (pediatric): Secondary | ICD-10-CM

## 2022-11-09 DIAGNOSIS — H6592 Unspecified nonsuppurative otitis media, left ear: Secondary | ICD-10-CM

## 2022-11-09 DIAGNOSIS — I10 Essential (primary) hypertension: Secondary | ICD-10-CM

## 2022-11-09 DIAGNOSIS — I890 Lymphedema, not elsewhere classified: Secondary | ICD-10-CM

## 2022-11-09 DIAGNOSIS — Z1159 Encounter for screening for other viral diseases: Secondary | ICD-10-CM

## 2022-11-09 DIAGNOSIS — Z6841 Body Mass Index (BMI) 40.0 and over, adult: Secondary | ICD-10-CM

## 2022-11-09 DIAGNOSIS — F901 Attention-deficit hyperactivity disorder, predominantly hyperactive type: Secondary | ICD-10-CM

## 2022-11-09 DIAGNOSIS — R7303 Prediabetes: Secondary | ICD-10-CM | POA: Diagnosis not present

## 2022-11-09 LAB — BAYER DCA HB A1C WAIVED: HB A1C (BAYER DCA - WAIVED): 5.9 % — ABNORMAL HIGH (ref 4.8–5.6)

## 2022-11-09 LAB — LIPID PANEL

## 2022-11-09 MED ORDER — WEGOVY 1 MG/0.5ML ~~LOC~~ SOAJ: 1.0000 mg | SUBCUTANEOUS | 0 refills | Status: DC

## 2022-11-09 MED ORDER — FLUTICASONE PROPIONATE 50 MCG/ACT NA SUSP: 2.0000 | Freq: Every day | NASAL | 6 refills | Status: DC

## 2022-11-09 MED ORDER — ONDANSETRON 4 MG PO TBDP: 4.0000 mg | ORAL_TABLET | Freq: Three times a day (TID) | ORAL | 0 refills | Status: DC | PRN

## 2022-11-09 MED ORDER — WEGOVY 1.7 MG/0.75ML ~~LOC~~ SOAJ: 1.7000 mg | SUBCUTANEOUS | 0 refills | Status: DC

## 2022-11-09 MED ORDER — LEVOCETIRIZINE DIHYDROCHLORIDE 5 MG PO TABS: 5.0000 mg | ORAL_TABLET | Freq: Every evening | ORAL | 3 refills | Status: DC

## 2022-11-09 MED ORDER — WEGOVY 0.25 MG/0.5ML ~~LOC~~ SOAJ: 0.2500 mg | SUBCUTANEOUS | 0 refills | Status: DC

## 2022-11-09 MED ORDER — WEGOVY 0.5 MG/0.5ML ~~LOC~~ SOAJ: 0.5000 mg | SUBCUTANEOUS | 0 refills | Status: DC

## 2022-11-09 NOTE — Patient Instructions (Signed)
Tips for success with Wegovy (and by success, how not to be super sick on your stomach): Eat small meals AVOID heavy foods (fried/ high in carbs like bread, pasta, rice) AVOID carbonated beverages (soda/ beer, as these can increase bloating) DOUBLE your water intake (will help you avoid constipation/ dehydration)  WHQPRF CAN cause: Nausea Abdominal pain Increased acid reflux (sometimes presents as "sour burps") Constipation OR Diarrhea Fatigue (especially when you first start it)

## 2022-11-09 NOTE — Progress Notes (Signed)
Subjective: CC: Morbid obesity PCP: Janora Norlander, DO PPJ:Mason Walker is a 27 y.o. male presenting to clinic today for:  1.  Morbid obesity with BMI over 60, associated with lymphedema of bilateral lower extremities and obstructive sleep apnea Patient notes that he is ready to go on GLP.  There is no family history of medullary thyroid cancer, multiple endocrine type II neoplasia syndrome.  He has no personal history of pancreatitis.  He does have a diagnosis of obstructive sleep apnea.  Working on getting CPAP supplies.  Has tried multiple modalities of weight loss in the past including restriction, attempts to increase physical exercise but nothing really has been attainable.  Continues to suffer from lower extremity edema/lymphedema.  He tried getting lymphatic pumps for those legs but insurance did not cover.  He is requesting a prescription for compression hose.  2.  Ear fullness Patient reports that he woke up this morning with ear fullness.  He is not currently treated with any oral antihistamines and uses Flonase intermittently.  No reports of fevers, drainage from the ears.   ROS: Per HPI  No Known Allergies Past Medical History:  Diagnosis Date   Allergy    Asthma    HTN (hypertension)    no meds at this time   Pneumonia 10/2018   Sickle cell trait ([place])    Sleep apnea    c-pap    Current Outpatient Medications:    atomoxetine (STRATTERA) 60 MG capsule, TAKE 1 CAPSULE BY MOUTH EVERY DAY, Disp: 90 capsule, Rfl: 3   diclofenac (VOLTAREN) 75 MG EC tablet, Take 1 tablet (75 mg total) by mouth 2 (two) times daily., Disp: 60 tablet, Rfl: 1   escitalopram (LEXAPRO) 10 MG tablet, Take 1 tablet (10 mg total) by mouth daily., Disp: 90 tablet, Rfl: 1 Social History   Socioeconomic History   Marital status: Single    Spouse name: Not on file   Number of children: Not on file   Years of education: Not on file   Highest education level: Not on file  Occupational  History   Occupation: student  Tobacco Use   Smoking status: Never   Smokeless tobacco: Never  Vaping Use   Vaping Use: Never used  Substance and Sexual Activity   Alcohol use: No    Comment: rare   Drug use: No   Sexual activity: Not on file  Other Topics Concern   Not on file  Social History Narrative   Not on file   Social Determinants of Health   Financial Resource Strain: Not on file  Food Insecurity: Not on file  Transportation Needs: Not on file  Physical Activity: Not on file  Stress: Not on file  Social Connections: Not on file  Intimate Partner Violence: Not on file   Family History  Problem Relation Age of Onset   Fibromyalgia Mother    Diabetes Father    Hypertension Father    Hypertension Sister    Hypertension Maternal Grandmother    Hypertension Maternal Grandfather    Alcohol abuse Paternal Grandmother    Heart disease Paternal Grandfather     Objective: Office vital signs reviewed. BP 137/82   Pulse 91   Temp 98.5 F (36.9 C)   Ht 5\' 6"  (1.676 m)   Wt (!) 427 lb (193.7 kg)   SpO2 94%   BMI 68.92 kg/m   Physical Examination:  General: Awake, alert, super morbidly obese, No acute distress HEENT: TMs intact bilaterally with  appreciable clear effusions.  No perforation or associated erythema Cardio: regular rate and rhythm, S1S2 heard, no murmurs appreciated Pulm: clear to auscultation bilaterally, no wheezes, rhonchi or rales; normal work of breathing on room air Extremities: Lower extremities have edema bilaterally  Assessment/ Plan: 27 y.o. male   Class 3 severe obesity due to excess calories with serious comorbidity and body mass index (BMI) of 60.0 to 69.9 in adult Mason Walker) - Plan: Testosterone, CMP14+EGFR, Lipid Panel, TSH, Bayer DCA Hb A1c Waived, Semaglutide-Weight Management (WEGOVY) 0.25 MG/0.5ML SOAJ, Semaglutide-Weight Management (WEGOVY) 0.5 MG/0.5ML SOAJ, Semaglutide-Weight Management (WEGOVY) 1 MG/0.5ML SOAJ, Semaglutide-Weight  Management (WEGOVY) 1.7 MG/0.75ML SOAJ, ondansetron (ZOFRAN-ODT) 4 MG disintegrating tablet  Essential hypertension - Plan: Semaglutide-Weight Management (WEGOVY) 0.25 MG/0.5ML SOAJ, Semaglutide-Weight Management (WEGOVY) 0.5 MG/0.5ML SOAJ, Semaglutide-Weight Management (WEGOVY) 1 MG/0.5ML SOAJ, Semaglutide-Weight Management (WEGOVY) 1.7 MG/0.75ML SOAJ  Pre-diabetes - Plan: Semaglutide-Weight Management (WEGOVY) 0.25 MG/0.5ML SOAJ, Semaglutide-Weight Management (WEGOVY) 0.5 MG/0.5ML SOAJ, Semaglutide-Weight Management (WEGOVY) 1 MG/0.5ML SOAJ, Semaglutide-Weight Management (WEGOVY) 1.7 MG/0.75ML SOAJ  OSA on CPAP - Plan: Semaglutide-Weight Management (WEGOVY) 0.25 MG/0.5ML SOAJ, Semaglutide-Weight Management (WEGOVY) 0.5 MG/0.5ML SOAJ, Semaglutide-Weight Management (WEGOVY) 1 MG/0.5ML SOAJ, Semaglutide-Weight Management (WEGOVY) 1.7 MG/0.75ML SOAJ  Acquired lymphedema of leg - Plan: Compression stockings  Middle ear effusion, left - Plan: CBC, levocetirizine (XYZAL) 5 MG tablet, fluticasone (FLONASE) 50 MCG/ACT nasal spray  Attention deficit hyperactivity disorder (ADHD), predominantly hyperactive type  Encounter for hepatitis C screening test for low risk patient - Plan: Hepatitis C antibody  Given BMI over 60 I definitely think patient needs to be on something to help with weight loss.  Because of OSA, hypertension I do think that a GLP would be a good option for this patient, specifically semaglutide given its cardiovascular benefits.  I suspect we will need to do prior authorization.  He has had total unsuccessful attempts at weight loss independently and has gradually gained more and more weight every year.  He certainly has a high risk of sudden death given current BMI.  We discussed the risks versus benefits of medication and likely side effects including nausea, abdominal pain and even diarrhea.  We discussed absolute need for reduction in carbohydrates and portion sizes.  Hydrate well.  I  would like to see him back in 3 months, sooner if concerns arise  I have given him Xyzal and Flonase for his ear effusions.  This is likely secondary to allergic rhinitis.  No evidence of active infection at this time  Rx for compression hose provided today  Nonfasting labs were obtained today and demonstrated prediabetes   No orders of the defined types were placed in this encounter.  No orders of the defined types were placed in this encounter.    Janora Norlander, DO [place] [phone]

## 2022-11-10 ENCOUNTER — Telehealth: Payer: Self-pay | Admitting: *Deleted

## 2022-11-10 LAB — CMP14+EGFR
ALT: 43 IU/L (ref 0–44)
AST: 26 IU/L (ref 0–40)
Albumin/Globulin Ratio: 1.3 (ref 1.2–2.2)
Albumin: 3.5 g/dL — ABNORMAL LOW (ref 4.3–5.2)
Alkaline Phosphatase: 93 IU/L (ref 44–121)
BUN/Creatinine Ratio: 13 (ref 9–20)
BUN: 11 mg/dL (ref 6–20)
Bilirubin Total: 0.3 mg/dL (ref 0.0–1.2)
CO2: 23 mmol/L (ref 20–29)
Calcium: 9.3 mg/dL (ref 8.7–10.2)
Chloride: 105 mmol/L (ref 96–106)
Creatinine, Ser: 0.85 mg/dL (ref 0.76–1.27)
Globulin, Total: 2.6 g/dL (ref 1.5–4.5)
Glucose: 114 mg/dL — ABNORMAL HIGH (ref 70–99)
Potassium: 4.3 mmol/L (ref 3.5–5.2)
Sodium: 143 mmol/L (ref 134–144)
Total Protein: 6.1 g/dL (ref 6.0–8.5)
eGFR: 121 mL/min/{1.73_m2} (ref >59)

## 2022-11-10 LAB — TESTOSTERONE: Testosterone: 156 ng/dL — ABNORMAL LOW (ref 264–916)

## 2022-11-10 LAB — CBC
Hematocrit: 42.6 % (ref 37.5–51.0)
Hemoglobin: 14.3 g/dL (ref 13.0–17.7)
MCH: 27.8 pg (ref 26.6–33.0)
MCHC: 33.6 g/dL (ref 31.5–35.7)
MCV: 83 fL (ref 79–97)
Platelets: 221 10*3/uL (ref 150–450)
RBC: 5.14 x10E6/uL (ref 4.14–5.80)
RDW: 12.2 % (ref 11.6–15.4)
WBC: 6 10*3/uL (ref 3.4–10.8)

## 2022-11-10 LAB — TSH: TSH: 1.82 u[IU]/mL (ref 0.450–4.500)

## 2022-11-10 LAB — LIPID PANEL
Chol/HDL Ratio: 5.5 ratio — ABNORMAL HIGH (ref 0.0–5.0)
Cholesterol, Total: 219 mg/dL — ABNORMAL HIGH (ref 100–199)
HDL: 40 mg/dL (ref >39)
LDL Chol Calc (NIH): 123 mg/dL — ABNORMAL HIGH (ref 0–99)
Triglycerides: 320 mg/dL — ABNORMAL HIGH (ref 0–149)
VLDL Cholesterol Cal: 56 mg/dL — ABNORMAL HIGH (ref 5–40)

## 2022-11-10 LAB — HEPATITIS C ANTIBODY: Hep C Virus Ab: NONREACTIVE

## 2022-11-10 NOTE — Telephone Encounter (Signed)
Praise Dolecki (Key: [id number]) Rx #: [id number] [phone] 0.25MG /0.5ML auto-injectors  Sent to Plan

## 2022-11-10 NOTE — Telephone Encounter (Signed)
Approved.  

## 2022-11-15 ENCOUNTER — Telehealth: Payer: Self-pay | Admitting: Family Medicine

## 2022-11-15 NOTE — Telephone Encounter (Signed)
There really is no way around his insurance company with that med.  If he really wants to be on GLP, he might consider one of the weight loss clinics but really unsure as to what the cost would be.  Alternatively, I can refer him to a bariatric surgeon.

## 2022-11-17 ENCOUNTER — Encounter: Payer: Self-pay | Admitting: Family Medicine

## 2022-12-16 ENCOUNTER — Encounter: Payer: Self-pay | Admitting: Radiology

## 2023-01-10 ENCOUNTER — Telehealth: Payer: Self-pay | Admitting: Family Medicine

## 2023-01-10 ENCOUNTER — Other Ambulatory Visit: Payer: Self-pay | Admitting: Family Medicine

## 2023-01-10 DIAGNOSIS — R7989 Other specified abnormal findings of blood chemistry: Secondary | ICD-10-CM

## 2023-01-10 NOTE — Telephone Encounter (Signed)
It's just for testosterone. No he doesn't need to fast.

## 2023-01-11 NOTE — Telephone Encounter (Signed)
Pt informed and understood. He has no further concerns. 

## 2023-01-19 ENCOUNTER — Encounter: Payer: Self-pay | Admitting: Family Medicine

## 2023-01-19 ENCOUNTER — Ambulatory Visit (INDEPENDENT_AMBULATORY_CARE_PROVIDER_SITE_OTHER): Payer: Commercial Managed Care - PPO | Admitting: Family Medicine

## 2023-01-19 VITALS — BP 137/85 | HR 78 | Temp 98.3°F | Ht 66.0 in | Wt >= 6400 oz

## 2023-01-19 DIAGNOSIS — Z6841 Body Mass Index (BMI) 40.0 and over, adult: Secondary | ICD-10-CM

## 2023-01-19 DIAGNOSIS — K429 Umbilical hernia without obstruction or gangrene: Secondary | ICD-10-CM

## 2023-01-19 DIAGNOSIS — I10 Essential (primary) hypertension: Secondary | ICD-10-CM

## 2023-01-19 DIAGNOSIS — R7989 Other specified abnormal findings of blood chemistry: Secondary | ICD-10-CM | POA: Insufficient documentation

## 2023-01-19 DIAGNOSIS — E782 Mixed hyperlipidemia: Secondary | ICD-10-CM

## 2023-01-19 DIAGNOSIS — F411 Generalized anxiety disorder: Secondary | ICD-10-CM

## 2023-01-19 DIAGNOSIS — J9801 Acute bronchospasm: Secondary | ICD-10-CM

## 2023-01-19 DIAGNOSIS — F901 Attention-deficit hyperactivity disorder, predominantly hyperactive type: Secondary | ICD-10-CM

## 2023-01-19 MED ORDER — ONDANSETRON 4 MG PO TBDP
4.0000 mg | ORAL_TABLET | Freq: Three times a day (TID) | ORAL | 0 refills | Status: AC | PRN
Start: 2023-01-19 — End: ?

## 2023-01-19 MED ORDER — ATOMOXETINE HCL 60 MG PO CAPS: ORAL_CAPSULE | ORAL | 3 refills | Status: DC

## 2023-01-19 MED ORDER — ESCITALOPRAM OXALATE 20 MG PO TABS
20.0000 mg | ORAL_TABLET | Freq: Every day | ORAL | 3 refills | Status: DC
Start: 2023-01-19 — End: 2024-02-06

## 2023-01-19 MED ORDER — WEGOVY 2.4 MG/0.75ML ~~LOC~~ SOAJ
2.4000 mg | SUBCUTANEOUS | 3 refills | Status: DC
Start: 2023-01-19 — End: 2023-09-06

## 2023-01-19 MED ORDER — FLUTICASONE PROPIONATE 50 MCG/ACT NA SUSP: 2.0000 | Freq: Every day | NASAL | 6 refills | Status: DC

## 2023-01-19 MED ORDER — FLUTICASONE-SALMETEROL 250-50 MCG/ACT IN AEPB: 1.0000 | INHALATION_SPRAY | Freq: Two times a day (BID) | RESPIRATORY_TRACT | 3 refills | Status: DC

## 2023-01-19 MED ORDER — LEVOCETIRIZINE DIHYDROCHLORIDE 5 MG PO TABS: 5.0000 mg | ORAL_TABLET | Freq: Every evening | ORAL | 3 refills | Status: DC

## 2023-01-19 NOTE — Progress Notes (Signed)
Subjective: CC: Follow-up hypogonadism PCP: Janora Norlander, DO RY:[id number] Mason Walker is a 27 y.o. male presenting to clinic today for:  1.  Low testosterone/morbid obesity Patient did not realize that he had to come back again before 10 AM to get this done.  He totally forgot to do that for lab draw.  He will come in first thing in the morning to have this drawn.  Continues to suffer from the same ailments.  He is having a positive experience with the [facility] injection and has lost about 10 pounds since her last visit.  He has noticed a little bit more nausea and acid reflux with the 1 mg injection but so far tolerating this without difficulty.  Has Zofran on hand if needed for breakthrough nausea.  2.  Depression, anxiety, ADHD ADHD stable.  Would like to increase the Lexapro to 20 mg if possible.  Does not feel that mood is totally at goal  3.  Shortness of breath Patient reports wheezing and shortness of breath, particularly with exertion.  Symptoms are slightly better today but certainly have been worse since the allergy season started.  He was previously on Advair as a kid and he would like to go back on something similar but that is affordable if possible.  Albuterol has not been especially helpful in the past alone.  4.  Umbilical hernia Patient notes an umbilical hernia that sometimes is tender if it gets hit.  He denies any changes in bowel habits, bleeding in stool.  It does not seem to be getting any larger but he was not quite sure when he should be worried about it   ROS: Per HPI  No Known Allergies Past Medical History:  Diagnosis Date   Allergy    Asthma    HTN (hypertension)    no meds at this time   Pneumonia 10/2018   Sickle cell trait ([facility])    Sleep apnea    c-pap    Current Outpatient Medications:    atomoxetine (STRATTERA) 60 MG capsule, TAKE 1 CAPSULE BY MOUTH EVERY DAY, Disp: 90 capsule, Rfl: 3   diclofenac (VOLTAREN) 75 MG EC tablet, Take 1 tablet (75  mg total) by mouth 2 (two) times daily., Disp: 60 tablet, Rfl: 1   escitalopram (LEXAPRO) 10 MG tablet, Take 1 tablet (10 mg total) by mouth daily., Disp: 90 tablet, Rfl: 1   fluticasone (FLONASE) 50 MCG/ACT nasal spray, Place 2 sprays into both nostrils daily., Disp: 16 g, Rfl: 6   levocetirizine (XYZAL) 5 MG tablet, Take 1 tablet (5 mg total) by mouth every evening., Disp: 90 tablet, Rfl: 3   ondansetron (ZOFRAN-ODT) 4 MG disintegrating tablet, Take 1 tablet (4 mg total) by mouth every 8 (eight) hours as needed for nausea or vomiting., Disp: 20 tablet, Rfl: 0   Semaglutide-Weight Management (WEGOVY) 0.25 MG/0.5ML SOAJ, Inject 0.25 mg into the skin every 7 (seven) days. Month #1, Disp: 2 mL, Rfl: 0   Semaglutide-Weight Management (WEGOVY) 0.5 MG/0.5ML SOAJ, Inject 0.5 mg into the skin every 7 (seven) days. Month #2, Disp: 2 mL, Rfl: 0   Semaglutide-Weight Management (WEGOVY) 1 MG/0.5ML SOAJ, Inject 1 mg into the skin every 7 (seven) days. Month #3, Disp: 2 mL, Rfl: 0   Semaglutide-Weight Management (WEGOVY) 1.7 MG/0.75ML SOAJ, Inject 1.7 mg into the skin every 7 (seven) days. Month#4, Disp: 3 mL, Rfl: 0 Social History   Socioeconomic History   Marital status: Single    Spouse name: Not on  file   Number of children: Not on file   Years of education: Not on file   Highest education level: Not on file  Occupational History   Occupation: student  Tobacco Use   Smoking status: Never   Smokeless tobacco: Never  Vaping Use   Vaping Use: Never used  Substance and Sexual Activity   Alcohol use: No    Comment: rare   Drug use: No   Sexual activity: Not on file  Other Topics Concern   Not on file  Social History Narrative   Not on file   Social Determinants of Health   Financial Resource Strain: Not on file  Food Insecurity: Not on file  Transportation Needs: Not on file  Physical Activity: Not on file  Stress: Not on file  Social Connections: Not on file  Intimate Partner Violence:  Not on file   Family History  Problem Relation Age of Onset   Fibromyalgia Mother    Diabetes Father    Hypertension Father    Hypertension Sister    Hypertension Maternal Grandmother    Hypertension Maternal Grandfather    Alcohol abuse Paternal Grandmother    Heart disease Paternal Grandfather     Objective: Office vital signs reviewed. BP 137/85   Pulse 78   Temp 98.3 F (36.8 C)   Ht 5\' 6"  (1.676 m)   Wt (!) 412 lb (186.9 kg)   SpO2 93%   BMI 66.50 kg/m   Physical Examination:  General: Awake, alert, super morbidly obese, No acute distress HEENT: Sclera white.  Moist mucous membranes Cardio: regular rate and rhythm, S1S2 heard, no murmurs appreciated Pulm: clear to auscultation bilaterally, no wheezes, rhonchi or rales; normal work of breathing on room air GI: Obese.  Gumball sized umbilical hernia noted in the superior aspect of the umbilicus.  This is reducible.  No erythema, induration.  Bowel sounds not palpable Psych: Mood stable.  Playful.  Very interactive     01/19/2023    4:19 PM 11/09/2022    8:50 AM 09/15/2022    1:21 PM  Depression screen PHQ 2/9  Decreased Interest 0 0 0  Down, Depressed, Hopeless 0 0 0  PHQ - 2 Score 0 0 0  Altered sleeping 0 0 0  Tired, decreased energy 0 0 0  Change in appetite 0 1 0  Feeling bad or failure about yourself  0 0 0  Trouble concentrating 0 0 0  Moving slowly or fidgety/restless 0 0 0  Suicidal thoughts 0 0 0  PHQ-9 Score 0 1 0  Difficult doing work/chores Not difficult at all Somewhat difficult Not difficult at all      01/19/2023    4:19 PM 11/09/2022    8:50 AM 09/15/2022    1:21 PM 03/03/2022    8:44 AM  GAD 7 : Generalized Anxiety Score  Nervous, Anxious, on Edge 0 0 2 0  Control/stop worrying 0 1 1 0  Worry too much - different things 0 1 1 0  Trouble relaxing 0 0 0 0  Restless 0 0 0 0  Easily annoyed or irritable 0 1 0 0  Afraid - awful might happen 0 0 0 0  Total GAD 7 Score 0 3 4 0  Anxiety  Difficulty Not difficult at all Somewhat difficult Not difficult at all Not difficult at all      Assessment/ Plan: 27 y.o. male   Low testosterone in male  Mixed hyperlipidemia - Plan: Semaglutide-Weight Management (WEGOVY)  2.4 MG/0.75ML SOAJ  Class 3 severe obesity due to excess calories with serious comorbidity and body mass index (BMI) of 60.0 to 69.9 in adult - Plan: ondansetron (ZOFRAN-ODT) 4 MG disintegrating tablet, Semaglutide-Weight Management (WEGOVY) 2.4 MG/0.75ML SOAJ  Essential hypertension  Umbilical hernia without obstruction and without gangrene  Bronchospasm - Plan: fluticasone-salmeterol (WIXELA INHUB) 250-50 MCG/ACT AEPB  Attention deficit hyperactivity disorder (ADHD), predominantly hyperactive type - Plan: atomoxetine (STRATTERA) 60 MG capsule  Generalized anxiety disorder - Plan: escitalopram (LEXAPRO) 20 MG tablet  He has future order in for repeat testosterone.  Anticipate need to start topical testosterone.  Discussed application.  I would like to see him back in 4 months to repeat testosterone level, CBC and PSA.  Will complete UDS and CSC at that office visit if we plan to continue that medication  Advance Wegovy to 2.4 mg as tolerated.  Seemingly having a good response to the medication so far.  Zofran renewed for as needed use.  Blood pressure controlled.  No changes  Umbilical hernia reducible and does not appear to be obstructed or have gangrene.  We discussed signs and symptoms warranting emergent evaluation.  Discussed elective referral but he would like to hold off on this for now.  AVS provided  Wixela sent given good response to Advair in the past.  Discussed rinsing mouth out after each use.  Pulmonary exam was unremarkable today.  Bronchospasm likely compounded by body habitus and allergies  Lexapro advance to 20 mg daily.  Continue Strattera at current dose.  Both have been refilled x 1 year  No orders of the defined types were placed in  this encounter.  No orders of the defined types were placed in this encounter.    Janora Norlander, DO [place] [phone]

## 2023-01-19 NOTE — Patient Instructions (Signed)
Hernia, Adult     A hernia happens when an organ or tissue inside your body pushes out through a weak spot in the muscles of your belly (abdomen). This makes a bulge. The bulge may be: In a scar from a surgery that was done in your belly (incisional hernia). Near your belly button (umbilical hernia). In your groin (inguinal hernia). Your groin is the area where your leg meets your lower belly. If you are a male, this type could also be in your scrotum. In your upper thigh (femoral hernia). Inside your belly (hiatal hernia). This happens when your stomach slides above the muscle between your belly and your chest (diaphragm). What are the causes? This condition may be caused by: Lifting heavy things. Coughing over a long period of time. Having trouble pooping (constipation). Trouble pooping can lead to straining. A cut from surgery in your belly. A physical problem that is present at birth. Being very overweight. Smoking. Too much fluid in your belly. A testicle that has not moved down into the scrotum, in males. What are the signs or symptoms? The main symptom is a bulge in the area of the hernia, but a bulge may not always be seen. It may grow bigger or be easier to see when you cough or strain (such as when lifting something heavy). A hernia that can be pushed back into the belly rarely causes pain. A hernia that cannot be pushed back into the belly may lose its blood supply. This may cause: Pain. Fever. A feeling like you may vomit, and vomiting. Swelling. Trouble pooping. How is this treated? A hernia that is small and painless may not need to be treated. A hernia that is large or painful may be treated with surgery. Surgery to treat a hernia involves pushing the bulge back into place and repairing the weak area of the muscle or belly. Follow these instructions at home: Activity Avoid straining the muscles near your hernia. This can happen when you: Lift something heavy. Poop  (have a bowel movement). Do not lift anything that is heavier than 10 lb (4.5 kg), or the limit that you are told. When you lift something heavy, use your leg muscles. Do not use your back muscles to lift. Prevent trouble pooping If told by your doctor, take steps to prevent trouble pooping. You may need to: Drink enough fluid to keep your pee (urine) pale yellow. Take medicines. You will be told what medicines to take. Eat foods that are high in fiber. These include beans, whole grains, and fresh fruits and vegetables. Limit foods that are high in fat and sugar. These include fried or sweet foods. General instructions When you cough, try to cough gently. You may try to push your hernia back in by gently pressing on it when you are lying down. Do not try to force the bulge back in if it will not go in easily. If you are overweight, work with your doctor to lose weight safely. Do not smoke or use any products that contain nicotine or tobacco. If you need help quitting, ask your doctor. If you will be having surgery, watch your hernia for changes in shape, size, or color. Tell your doctor if you see any changes. Take over-the-counter and prescription medicines only as told by your doctor. Keep all follow-up visits. Contact a doctor if: You get new pain, swelling, or redness near your hernia. You poop fewer times in a week than normal. You have trouble pooping. You have   poop that is more dry than normal. You have poop that is harder or larger than normal. Get help right away if: You have a fever or chills. You have belly pain that gets worse. You feel like you may vomit, or you vomit. Your hernia cannot be pushed in by gently pressing on it when you are lying down. Your hernia: Changes in shape or size. Changes color. Feels hard, or it hurts when you touch it. These symptoms may be an emergency. Get help right away. Call your local emergency services (911 in the [place] Do not wait to  see if the symptoms will go away. Do not drive yourself to the hospital. Summary A hernia happens when an organ or tissue inside your body pushes out through a weak spot in the belly muscles. This creates a bulge. If your hernia is small and it does not hurt, you may not need treatment. If your hernia is large or it hurts, you may need surgery. If you will be having surgery, watch your hernia for changes in shape, size, or color. Tell your doctor about any changes. This information is not intended to replace advice given to you by your health care provider. Make sure you discuss any questions you have with your health care provider. Document Revised: 05/12/2020 Document Reviewed: 05/12/2020 Elsevier Patient Education  2023 Elsevier Inc[employer]  

## 2023-01-20 ENCOUNTER — Telehealth: Payer: Self-pay | Admitting: Family Medicine

## 2023-01-20 ENCOUNTER — Other Ambulatory Visit: Payer: Commercial Managed Care - PPO

## 2023-01-20 DIAGNOSIS — R7989 Other specified abnormal findings of blood chemistry: Secondary | ICD-10-CM

## 2023-01-21 LAB — TESTOSTERONE: Testosterone: 150 ng/dL — ABNORMAL LOW (ref 264–916)

## 2023-01-24 ENCOUNTER — Other Ambulatory Visit: Payer: Self-pay | Admitting: Family Medicine

## 2023-01-24 DIAGNOSIS — M79671 Pain in right foot: Secondary | ICD-10-CM

## 2023-01-24 DIAGNOSIS — Z5181 Encounter for therapeutic drug level monitoring: Secondary | ICD-10-CM

## 2023-01-24 DIAGNOSIS — R7989 Other specified abnormal findings of blood chemistry: Secondary | ICD-10-CM

## 2023-01-24 DIAGNOSIS — F411 Generalized anxiety disorder: Secondary | ICD-10-CM

## 2023-01-24 MED ORDER — TESTOSTERONE 50 MG/5GM (1%) TD GEL
5.0000 g | Freq: Every day | TRANSDERMAL | 3 refills | Status: DC
Start: 2023-01-24 — End: 2023-12-24

## 2023-01-24 MED ORDER — DICLOFENAC SODIUM 75 MG PO TBEC
75.0000 mg | DELAYED_RELEASE_TABLET | Freq: Two times a day (BID) | ORAL | 1 refills | Status: DC
Start: 2023-01-24 — End: 2023-03-28

## 2023-01-24 NOTE — Telephone Encounter (Signed)
Mason Walker (Key: [id number]) PA Case ID #: [id number] Rx #: [id number] Need Help? Call us at [phone] Status sent iconSent to Plan today Drug Testosterone 50 MG/5GM(1%) gel ePA cloud logo Form [employer] Electronic PA Form [phone] NCPDP)

## 2023-01-25 NOTE — Telephone Encounter (Signed)
Patient Advocate Encounter  Prior Authorization for Testosterone 50mg /5gm (1%) gel has been approved.    PA# [id number] Effective dates: 12/25/22 through 01/24/24

## 2023-01-26 ENCOUNTER — Other Ambulatory Visit: Payer: Self-pay | Admitting: Family Medicine

## 2023-01-26 DIAGNOSIS — J9801 Acute bronchospasm: Secondary | ICD-10-CM

## 2023-01-26 NOTE — Telephone Encounter (Signed)
  WIXELA INHUB 250-50 MCG/ACT AEPB        Changed from: fluticasone-salmeterol (WIXELA INHUB) 250-50 MCG/ACT AEPB    Pharmacy comment: Alternative Requested:ADVAIR BRAND AND GENERIC BOTH NOT COVERED WITHOUT A PRIOR AUTH; PLEASE CONTACT INSURANCE OR SEND ALTERNATIE.

## 2023-01-27 ENCOUNTER — Other Ambulatory Visit: Payer: Self-pay | Admitting: Family Medicine

## 2023-01-27 DIAGNOSIS — J4520 Mild intermittent asthma, uncomplicated: Secondary | ICD-10-CM

## 2023-01-27 MED ORDER — QVAR REDIHALER 80 MCG/ACT IN AERB
2.0000 | INHALATION_SPRAY | Freq: Two times a day (BID) | RESPIRATORY_TRACT | 12 refills | Status: DC
Start: 2023-01-27 — End: 2024-06-08

## 2023-02-02 ENCOUNTER — Ambulatory Visit: Payer: Commercial Managed Care - PPO | Admitting: Family Medicine

## 2023-03-26 ENCOUNTER — Other Ambulatory Visit: Payer: Self-pay | Admitting: Family Medicine

## 2023-03-26 DIAGNOSIS — M79672 Pain in left foot: Secondary | ICD-10-CM

## 2023-05-05 ENCOUNTER — Ambulatory Visit (INDEPENDENT_AMBULATORY_CARE_PROVIDER_SITE_OTHER): Payer: Commercial Managed Care - PPO | Admitting: Family

## 2023-05-05 ENCOUNTER — Encounter: Payer: Self-pay | Admitting: Family

## 2023-05-05 VITALS — BP 124/79 | HR 94 | Temp 97.9°F | Ht 66.0 in | Wt 395.0 lb

## 2023-05-05 DIAGNOSIS — K429 Umbilical hernia without obstruction or gangrene: Secondary | ICD-10-CM

## 2023-05-05 DIAGNOSIS — R1033 Periumbilical pain: Secondary | ICD-10-CM

## 2023-05-05 NOTE — Progress Notes (Signed)
Subjective:    Patient ID: Mason Walker, male    DOB: 05-19-1996, 27 y.o.   MRN: [id number]  Chief Complaint  Patient presents with   Hernia    HPI Pt presents to the office today with a umbilical hernia that he noticed 4 months ago. However, over the last two days he noticed that the area is more tender. Reports mild intermittent aching pain 3-8 out 10. Denies any injury.    Review of Systems  All other systems reviewed and are negative.      Objective:   Physical Exam Vitals reviewed.  Constitutional:      General: He is not in acute distress.    Appearance: He is well-developed.  HENT:     Head: Normocephalic.     Right Ear: External ear normal.     Left Ear: External ear normal.     Mouth/Throat:     Mouth: Oropharynx is clear and moist.  Eyes:     General:        Right eye: No discharge.        Left eye: No discharge.     Pupils: Pupils are equal, round, and reactive to light.  Neck:     Thyroid: No thyromegaly.  Cardiovascular:     Rate and Rhythm: Normal rate and regular rhythm.     Heart sounds: Normal heart sounds. No murmur heard. Pulmonary:     Effort: Pulmonary effort is normal. No respiratory distress.     Breath sounds: Normal breath sounds. No wheezing.  Abdominal:     General: Bowel sounds are normal. There is no distension.     Palpations: Abdomen is soft.     Tenderness: There is abdominal tenderness. There is guarding.     Hernia: A hernia is present.  Musculoskeletal:        General: No tenderness or edema. Normal range of motion.     Cervical back: Normal range of motion and neck supple.  Skin:    General: Skin is warm and dry.     Findings: No erythema or rash.          Comments: Hernia present, no discoloration present of hernia, erythemas, tenderness, and warmth  Neurological:     Mental Status: He is alert and oriented to person, place, and time.     Cranial Nerves: No cranial nerve deficit.     Deep Tendon Reflexes: Reflexes  are normal and symmetric.  Psychiatric:        Mood and Affect: Mood and affect normal.        Behavior: Behavior normal.        Thought Content: Thought content normal.        Judgment: Judgment normal.     BP 124/79   Pulse 94   Temp 97.9 F (36.6 C)   Ht 5\' 6"  (1.676 m)   Wt (!) 395 lb (179.2 kg)   SpO2 92%   BMI 63.75 kg/m         Assessment & Plan:  [name] comes in today with chief complaint of Hernia   Diagnosis and orders addressed:  1. Umbilical hernia without obstruction and without gangrene - Ambulatory referral to General Surgery - CBC with Differential/Platelet - US Abdomen Limited; Future  2. Periumbilical abdominal pain - US Abdomen Limited; Future   Labs pending Unsure if pain is coming from hernia or cellulitis. Area around umbilical is erythemas, warm, and tender. [place] scheduled for 9 AM at  [facility]  Do not eat after 12 AM  Red flags discussed to go to ED   Jannifer Rodney, FNP

## 2023-05-05 NOTE — Patient Instructions (Signed)
Umbilical Hernia, Adult  A hernia is a bulge of tissue that pushes through an opening between muscles. An umbilical hernia happens in the abdomen, near the belly button (umbilicus). The hernia may contain tissues from the small intestine, large intestine, or fatty tissue covering the intestines. Umbilical hernias in adults tend to get worse over time, and they require surgical treatment. There are different types of umbilical hernias, including: Indirect hernia. This type is located just above or below the umbilicus. It is the most common type of umbilical hernia in adults. Direct hernia. This type forms through an opening formed by the umbilicus. Reducible hernia. This type of hernia comes and goes. It may be visible only when you strain, lift something heavy, or cough. This type of hernia can be pushed back into the abdomen (reduced). Incarcerated hernia. This type traps abdominal tissue inside the hernia. This type of hernia cannot be reduced. Strangulated hernia. This type of hernia cuts off blood flow to the tissues inside the hernia. The tissues can start to die if this happens. This type of hernia requires emergency treatment. What are the causes? An umbilical hernia happens when tissue inside the abdomen presses on a weak area of the abdominal muscles. What increases the risk? You may have a greater risk of this condition if you: Are obese. Have had several pregnancies. Have a buildup of fluid inside your abdomen. Have had surgery that weakens the abdominal muscles. What are the signs or symptoms? The main symptom of this condition is a painless bulge at or near the belly button. A reducible hernia may be visible only when you strain, lift something heavy, or cough. Other symptoms may include: Dull pain. A feeling of pressure. Symptoms of a strangulated hernia may include: Pain that gets increasingly worse. Nausea and vomiting. Pain when pressing on the hernia. Skin over the hernia  becoming red or purple. Constipation. Blood in the stool. How is this diagnosed? This condition may be diagnosed based on: A physical exam. You may be asked to cough or strain while standing. These actions increase the pressure inside your abdomen and can force the hernia through the opening in your muscles. Your health care provider may try to reduce the hernia by pressing on it. Your symptoms and medical history. How is this treated? Surgery is the only treatment for an umbilical hernia. Surgery for a strangulated hernia is done as soon as possible. If you have a small hernia that is not incarcerated, you may need to lose weight before having surgery. Follow these instructions at home: Lose weight, if told by your health care provider. Do not try to push the hernia back in. Watch your hernia for any changes in color or size. Tell your health care provider if any changes occur. You may need to avoid activities that increase pressure on your hernia. Do not lift anything that is heavier than 10 lb (4.5 kg), or the limit that you are told, until your health care provider says that it is safe. Take over-the-counter and prescription medicines only as told by your health care provider. Keep all follow-up visits. This is important. Contact a health care provider if: Your hernia gets larger. Your hernia becomes painful. Get help right away if: You develop sudden, severe pain near the area of your hernia. You have pain as well as nausea or vomiting. You have pain and the skin over your hernia changes color. You develop a fever or chills. Summary A hernia is a bulge of  tissue that pushes through an opening between muscles. An umbilical hernia happens near the belly button. Surgery is the only treatment for an umbilical hernia. Do not try to push your hernia back in. Keep all follow-up visits. This is important. This information is not intended to replace advice given to you by your health care  provider. Make sure you discuss any questions you have with your health care provider. Document Revised: 05/12/2020 Document Reviewed: 05/12/2020 Elsevier Patient Education  2024 [employer].

## 2023-05-06 ENCOUNTER — Ambulatory Visit (HOSPITAL_COMMUNITY)
Admission: RE | Admit: 2023-05-06 | Discharge: 2023-05-06 | Disposition: A | Discharge status: Home / Self Care | Payer: Commercial Managed Care - PPO | Source: Ambulatory Visit | Attending: Family | Admitting: Family

## 2023-05-06 DIAGNOSIS — R1033 Periumbilical pain: Secondary | ICD-10-CM | POA: Insufficient documentation

## 2023-05-06 DIAGNOSIS — K429 Umbilical hernia without obstruction or gangrene: Secondary | ICD-10-CM | POA: Diagnosis not present

## 2023-05-06 LAB — CBC WITH DIFFERENTIAL/PLATELET
Basophils Absolute: 0 10*3/uL (ref 0.0–0.2)
Basos: 1 %
EOS (ABSOLUTE): 0.1 10*3/uL (ref 0.0–0.4)
Eos: 2 %
Hematocrit: 42.5 % (ref 37.5–51.0)
Hemoglobin: 14.3 g/dL (ref 13.0–17.7)
Immature Grans (Abs): 0 10*3/uL (ref 0.0–0.1)
Immature Granulocytes: 0 %
Lymphocytes Absolute: 1.4 10*3/uL (ref 0.7–3.1)
Lymphs: 23 %
MCH: 27.6 pg (ref 26.6–33.0)
MCHC: 33.6 g/dL (ref 31.5–35.7)
MCV: 82 fL (ref 79–97)
Monocytes Absolute: 0.6 10*3/uL (ref 0.1–0.9)
Monocytes: 10 %
Neutrophils Absolute: 4 10*3/uL (ref 1.4–7.0)
Neutrophils: 64 %
Platelets: 240 10*3/uL (ref 150–450)
RBC: 5.19 x10E6/uL (ref 4.14–5.80)
RDW: 13.3 % (ref 11.6–15.4)
WBC: 6.2 10*3/uL (ref 3.4–10.8)

## 2023-05-19 ENCOUNTER — Encounter: Payer: Self-pay | Admitting: *Deleted

## 2023-06-02 ENCOUNTER — Encounter: Payer: Self-pay | Admitting: Family Medicine

## 2023-08-29 ENCOUNTER — Telehealth: Payer: Self-pay | Admitting: Family Medicine

## 2023-08-29 DIAGNOSIS — E782 Mixed hyperlipidemia: Secondary | ICD-10-CM

## 2023-08-30 ENCOUNTER — Other Ambulatory Visit (HOSPITAL_COMMUNITY): Payer: Self-pay

## 2023-08-30 ENCOUNTER — Encounter: Payer: Self-pay | Admitting: Family Medicine

## 2023-08-30 ENCOUNTER — Telehealth: Payer: Self-pay

## 2023-08-30 NOTE — Telephone Encounter (Signed)
Name from pharmacy: WEGOVY 2.4 MG/0.75 ML PEN  Pharmacy comment: Alternative Requested:PRIOR AUTHORIZATION REQUIRED.

## 2023-08-30 NOTE — Telephone Encounter (Signed)
PA request has been Submitted. New Encounter created for follow up. For additional info see Pharmacy Prior Auth telephone encounter from 08/30/23.

## 2023-08-30 NOTE — Telephone Encounter (Signed)
Pharmacy Patient Advocate Encounter   Received notification from Pt Calls Messages that prior authorization for [facility] 2.4MG /0.75ML auto-injectors is required/requested.   Insurance verification completed.   The patient is insured through [employer] .   Per test claim: PA required; PA submitted to above mentioned insurance via CoverMyMeds Key/confirmation #/EOC [id number] Status is pending

## 2023-08-31 ENCOUNTER — Other Ambulatory Visit (HOSPITAL_COMMUNITY): Payer: Self-pay

## 2023-08-31 NOTE — Telephone Encounter (Signed)
Pharmacy Patient Advocate Encounter  Received notification from [facility] that Prior Authorization for [facility] has been APPROVED from 08/01/23 to 08/30/24. Ran test claim, Copay is $74.99 (3 month supply). This test claim was processed through [facility]- copay amounts may vary at other pharmacies due to pharmacy/plan contracts, or as the patient moves through the different stages of their insurance plan.   PA #/Case ID/Reference #: [id number]

## 2023-09-05 ENCOUNTER — Telehealth: Payer: Self-pay | Admitting: Family Medicine

## 2023-09-05 NOTE — Telephone Encounter (Signed)
E2 made appt with [facility]

## 2023-09-05 NOTE — Telephone Encounter (Signed)
Copied from CRM [id number]. Topic: Clinical - Medication Refill >> Sep 05, 2023  8:24 AM Adelina Mings wrote: Reason for CRM: Reginal Lutes needs to be refilled he said his insurance is not covering it

## 2023-09-06 ENCOUNTER — Telehealth: Payer: Self-pay | Admitting: Family

## 2023-09-06 ENCOUNTER — Ambulatory Visit (INDEPENDENT_AMBULATORY_CARE_PROVIDER_SITE_OTHER): Payer: Commercial Managed Care - PPO | Admitting: Family

## 2023-09-06 VITALS — BP 140/94 | HR 93 | Temp 97.8°F | Ht 66.0 in | Wt >= 6400 oz

## 2023-09-06 DIAGNOSIS — E782 Mixed hyperlipidemia: Secondary | ICD-10-CM

## 2023-09-06 DIAGNOSIS — E66813 Obesity, class 3: Secondary | ICD-10-CM

## 2023-09-06 DIAGNOSIS — I10 Essential (primary) hypertension: Secondary | ICD-10-CM

## 2023-09-06 DIAGNOSIS — Z713 Dietary counseling and surveillance: Secondary | ICD-10-CM

## 2023-09-06 DIAGNOSIS — G4733 Obstructive sleep apnea (adult) (pediatric): Secondary | ICD-10-CM

## 2023-09-06 DIAGNOSIS — Z6841 Body Mass Index (BMI) 40.0 and over, adult: Secondary | ICD-10-CM

## 2023-09-06 DIAGNOSIS — I890 Lymphedema, not elsewhere classified: Secondary | ICD-10-CM

## 2023-09-06 MED ORDER — ZEPBOUND 10 MG/0.5ML ~~LOC~~ SOAJ: 10.0000 mg | SUBCUTANEOUS | 1 refills | Status: DC

## 2023-09-06 NOTE — Progress Notes (Signed)
Subjective:    Patient ID: Mason Walker, male    DOB: 1996/08/01, 27 y.o.   MRN: [id number]  Chief Complaint  Patient presents with   Weight Check    Wegovy not really working. Wants to try something different    Leg Pain    Diclofenac not helping    Pt presents to the office today to discuss weight loss. He has been taking Wegovy 2.4 mg. He has been taking since February 2024. He reports he has lose about 10 lbs total. BMI is 66.      09/06/2023    2:42 PM 05/05/2023    3:56 PM 01/19/2023    4:18 PM  Last 3 Weights  Weight (lbs) 409 lb 395 lb 412 lb  Weight (kg) 185.521 kg 179.171 kg 186.882 kg    He reports he is weight lifting 3 times a week for 60 mins.    He complaining of bilateral calf pain that is worse in left leg. He has seen vascular in the past and was told to wear compression hose. He wears the compression almost daily without relief. Was Taking diclofenac BID without relief.   He has HTN, hyperlipidemia, OSA, and prediabetes. His BP is not at goal today.  Leg Pain  The incident occurred more than 1 week ago. There was no injury mechanism. Pain location: bilateral calf pain, worse in left that has been going 2 years. The quality of the pain is described as aching. The pain is at a severity of 8/10. The pain is moderate. The pain has been Constant since onset. Pertinent negatives include no muscle weakness, numbness or tingling. He reports no foreign bodies present. The symptoms are aggravated by weight bearing. He has tried non-weight bearing and NSAIDs (compression hose) for the symptoms. The treatment provided mild relief.  Hypertension This is a chronic problem. The current episode started more than 1 year ago. The problem has been waxing and waning since onset. The problem is uncontrolled. Associated symptoms include shortness of breath.      Review of Systems  Respiratory:  Positive for shortness of breath.   Neurological:  Negative for tingling and numbness.   All other systems reviewed and are negative.      Objective:   Physical Exam Vitals reviewed.  Constitutional:      General: He is not in acute distress.    Appearance: He is well-developed. He is obese.  HENT:     Head: Normocephalic.     Right Ear: Tympanic membrane normal.     Left Ear: Tympanic membrane normal.  Eyes:     General:        Right eye: No discharge.        Left eye: No discharge.     Pupils: Pupils are equal, round, and reactive to light.  Neck:     Thyroid: No thyromegaly.  Cardiovascular:     Rate and Rhythm: Normal rate and regular rhythm.     Heart sounds: Normal heart sounds. No murmur heard. Pulmonary:     Effort: Pulmonary effort is normal. No respiratory distress.     Breath sounds: Normal breath sounds. No wheezing.  Abdominal:     General: Bowel sounds are normal. There is no distension.     Palpations: Abdomen is soft.     Tenderness: There is no abdominal tenderness.  Musculoskeletal:        General: Swelling present. No tenderness.     Cervical back: Normal range of  motion and neck supple.     Comments: Lymphedema present bilaterally, compression hose intact  Skin:    General: Skin is warm and dry.     Findings: No erythema or rash.  Neurological:     Mental Status: He is alert and oriented to person, place, and time.     Cranial Nerves: No cranial nerve deficit.     Deep Tendon Reflexes: Reflexes are normal and symmetric.  Psychiatric:        Behavior: Behavior normal.        Thought Content: Thought content normal.        Judgment: Judgment normal.       BP (!) 140/94   Pulse 93   Temp 97.8 F (36.6 C) (Temporal)   Ht 5\' 6"  (1.676 m)   Wt (!) 409 lb (185.5 kg)   SpO2 95%   BMI 66.01 kg/m      Assessment & Plan:   [name] comes in today with chief complaint of Weight Check [facility] not really working. Wants to try something different ) and Leg Pain (Diclofenac not helping )   Diagnosis and orders  addressed:  1. Class 3 severe obesity due to excess calories with serious comorbidity and body mass index (BMI) of 60.0 to 69.9 in adult (HCC) - tirzepatide (ZEPBOUND) 10 MG/0.5ML Pen; Inject 10 mg into the skin once a week.  Dispense: 3 mL; Refill: 1  2. Weight loss counseling, encounter for - tirzepatide (ZEPBOUND) 10 MG/0.5ML Pen; Inject 10 mg into the skin once a week.  Dispense: 3 mL; Refill: 1  3. Severe obstructive sleep apnea  - tirzepatide (ZEPBOUND) 10 MG/0.5ML Pen; Inject 10 mg into the skin once a week.  Dispense: 3 mL; Refill: 1  4. Mixed hyperlipidemia - tirzepatide (ZEPBOUND) 10 MG/0.5ML Pen; Inject 10 mg into the skin once a week.  Dispense: 3 mL; Refill: 1  5. Essential hypertension - tirzepatide (ZEPBOUND) 10 MG/0.5ML Pen; Inject 10 mg into the skin once a week.  Dispense: 3 mL; Refill: 1  6. Lymphedema of both lower extremities Compression hose daily Encourage massage  Elevated feet  May benefit from lymphedema clinic?   Stop Wegovy and start Zepbound 10 mg If covered follow up in 6 weeks  Health Maintenance reviewed Diet and exercise encouraged  Follow up plan: 6 weeks    Mason Rodney, FNP

## 2023-09-06 NOTE — Patient Instructions (Signed)
Lymphedema  Lymphedema is swelling that happens when an abnormal amount of lymph collects in the soft tissues under your skin. Lymph is fluid that moves through your lymphatic system. This system: Is part of your body's defense system, also called your immune system. Filters germs and waste from tissues in your body to your bloodstream. Lymphedema happens when your lymphatic system is blocked. This keeps lymph from draining as it should and leads to swelling. What are the causes? The cause of lymphedema depends on which type you have. Primary lymphedema is when you're born without lymph vessels or with lymph vessels that aren't normal. Secondary lymphedema is more common. It happens when lymph vessels are blocked or damaged from: Infection. Injury. Radiation therapy. Cancer. Scar tissue that forms. Surgery. What are the signs or symptoms? A swollen arm, leg, feet, toes, or fingers. A heavy or tight feeling in the swollen area. Skin that turns red near the swollen area. Not being able to move your arm or leg. Your arm or leg is sensitive to touch. Discomfort in your arm or leg. How is this diagnosed? Lymphedema may be diagnosed based on: Your symptoms and medical history. A physical exam. Bioimpedance spectroscopy. This test uses painless electrical currents. They help measure fluid levels in your body. Imaging tests, such as: MRI or CT scan. Duplex ultrasound. This test uses sound waves to make pictures on a screen. The pictures show your blood vessels and blood flow. Lymphoscintigraphy. In this test, a low dose of radioactive substance is given through a needle that goes through your skin. The substance traces the flow of lymph through your lymph vessels. Lymphangiography. In this test, a contrast dye is put into your lymph vessel. The dye helps show if the vessel is blocked. How is this treated?  If another condition is causing your lymphedema, that condition will be treated.  For example, antibiotics may be used to treat infection. Treatment for lymphedema depends on the cause. Treatment may include: Complete decongestive therapy (CDT). This lowers fluid buildup. CDT includes: Pressure (compression) wrapping of the area. Manual lymph drainage. This helps lymph drain out of your arm or leg. Certain exercises. These help fluid move out of your arm or leg. Compression. This puts pressure on your arm or leg to lower swelling. It includes: Compression stockings or sleeves. Special bandage wraps. Surgery. This is normally done only for severe cases that don't get better with other treatments. Follow these instructions at home: Self-care Your swollen area is more likely to get hurt or infected. To help prevent infection: Keep the area clean and dry. Use creams or lotions that your health care provider says are okay. These keep your skin moist. Protect your skin from cuts. Use gloves when you cook or garden. Do not walk barefoot. If you shave the area, use an Neurosurgeon. Do not wear tight clothes, shoes, or jewelry. Eat a healthy diet. Eat a lot of fruits and vegetables. Activity Do exercises as told by your provider. Do not sit with your legs crossed. When you can, keep the swollen leg or foot raised above the level of your heart. Avoid using an arm with lymphedema to carry things. General instructions Wear compression stockings or sleeves as told by your provider. Note any changes in size of the swollen arm or leg. You may be told to measure it at set times and track the results. Take over-the-counter and prescription medicines only as told by your provider. If you were prescribed antibiotics, use them  as told by your provider. Do not stop using the antibiotic even if you start to feel better or if your condition improves. Do not use heating pads or ice packs on the swollen area. Avoid having your swollen arm or leg used for: Blood draws. IVs. Blood  pressure checks. Contact a health care provider if: You get new swelling in your arm or leg all of a sudden. Fluid leaks from the skin of your swollen arm or leg. You have a cut that doesn't heal. The swollen area hurts or turns red. You get purple spots, a rash, blisters, or sores on your swollen arm or leg. You have a fever or chills. This information is not intended to replace advice given to you by your health care provider. Make sure you discuss any questions you have with your health care provider. Document Revised: 12/29/2022 Document Reviewed: 12/29/2022 Elsevier Patient Education  2024 [employer].

## 2023-09-07 ENCOUNTER — Telehealth: Payer: Self-pay

## 2023-09-07 ENCOUNTER — Other Ambulatory Visit (HOSPITAL_COMMUNITY): Payer: Self-pay

## 2023-09-07 NOTE — Telephone Encounter (Signed)
  Name from pharmacy: ZEPBOUND 10 MG/0.5 ML PEN   Pharmacy comment: Alternative Requested:PRIOR AUTH REQUIRED.

## 2023-09-07 NOTE — Telephone Encounter (Signed)
Pharmacy Patient Advocate Encounter   Received notification from Pt Calls Messages that prior authorization for Zepbound 10MG /0.5ML pen-injectors is required/requested.   Insurance verification completed.   The patient is insured through [employer] .   Per test claim: APPROVED from 08/08/23 to 05/04/24. Ran test claim, Copay is $24.99. This test claim was processed through [facility]- copay amounts may vary at other pharmacies due to pharmacy/plan contracts, or as the patient moves through the different stages of their insurance plan.    Key: [id number] PA Case ID #: [id number]

## 2023-09-07 NOTE — Telephone Encounter (Signed)
PA request has been Approved. New Encounter created for follow up. For additional info see Pharmacy Prior Auth telephone encounter from 09/07/23.

## 2023-11-08 ENCOUNTER — Other Ambulatory Visit: Payer: Self-pay | Admitting: Family

## 2023-11-08 DIAGNOSIS — I10 Essential (primary) hypertension: Secondary | ICD-10-CM

## 2023-11-08 DIAGNOSIS — E782 Mixed hyperlipidemia: Secondary | ICD-10-CM

## 2023-11-08 DIAGNOSIS — E66813 Obesity, class 3: Secondary | ICD-10-CM

## 2023-11-08 DIAGNOSIS — Z713 Dietary counseling and surveillance: Secondary | ICD-10-CM

## 2023-11-08 DIAGNOSIS — G4733 Obstructive sleep apnea (adult) (pediatric): Secondary | ICD-10-CM

## 2023-11-29 ENCOUNTER — Ambulatory Visit: Payer: Commercial Managed Care - PPO | Admitting: Family Medicine

## 2023-12-23 ENCOUNTER — Encounter: Payer: Self-pay | Admitting: Family Medicine

## 2023-12-23 ENCOUNTER — Ambulatory Visit (INDEPENDENT_AMBULATORY_CARE_PROVIDER_SITE_OTHER): Payer: Commercial Managed Care - PPO | Admitting: Family Medicine

## 2023-12-23 VITALS — BP 155/95 | HR 97 | Temp 98.5°F | Wt >= 6400 oz

## 2023-12-23 DIAGNOSIS — G4733 Obstructive sleep apnea (adult) (pediatric): Secondary | ICD-10-CM | POA: Diagnosis not present

## 2023-12-23 DIAGNOSIS — Z6841 Body Mass Index (BMI) 40.0 and over, adult: Secondary | ICD-10-CM

## 2023-12-23 DIAGNOSIS — I10 Essential (primary) hypertension: Secondary | ICD-10-CM

## 2023-12-23 DIAGNOSIS — H6993 Unspecified Eustachian tube disorder, bilateral: Secondary | ICD-10-CM

## 2023-12-23 DIAGNOSIS — E66813 Obesity, class 3: Secondary | ICD-10-CM

## 2023-12-23 DIAGNOSIS — Z713 Dietary counseling and surveillance: Secondary | ICD-10-CM | POA: Diagnosis not present

## 2023-12-23 DIAGNOSIS — Z711 Person with feared health complaint in whom no diagnosis is made: Secondary | ICD-10-CM

## 2023-12-23 DIAGNOSIS — E782 Mixed hyperlipidemia: Secondary | ICD-10-CM

## 2023-12-23 DIAGNOSIS — M79672 Pain in left foot: Secondary | ICD-10-CM

## 2023-12-23 DIAGNOSIS — M79671 Pain in right foot: Secondary | ICD-10-CM

## 2023-12-23 DIAGNOSIS — R7989 Other specified abnormal findings of blood chemistry: Secondary | ICD-10-CM

## 2023-12-23 DIAGNOSIS — R600 Localized edema: Secondary | ICD-10-CM

## 2023-12-23 MED ORDER — TIRZEPATIDE-WEIGHT MANAGEMENT 12.5 MG/0.5ML ~~LOC~~ SOAJ: 12.5000 mg | SUBCUTANEOUS | 4 refills | Status: DC

## 2023-12-23 MED ORDER — HYDROCHLOROTHIAZIDE 25 MG PO TABS: 25.0000 mg | ORAL_TABLET | Freq: Every day | ORAL | 3 refills | Status: DC

## 2023-12-23 MED ORDER — TIRZEPATIDE-WEIGHT MANAGEMENT 15 MG/0.5ML ~~LOC~~ SOAJ: 15.0000 mg | SUBCUTANEOUS | 3 refills | Status: DC

## 2023-12-23 NOTE — Progress Notes (Signed)
 Subjective: CC: Multiple concerns PCP: Mason Ip, DO VWU:[id number] [name] is a 28 y.o. male presenting to clinic today for:  1.  Hypogonadism He has been utilizing the topical AndroGel but has really not noticed any improvement in symptomology.  He also worries about potential exposure to his daughter.  He would like to switch over to injectable.  There is a nurse at work that can administer this for him.  2.  Obstructive sleep apnea associated with super morbid obesity Patient with history of severe obstructive sleep apnea.  He is on a CPAP machine but notes that it is becoming less efficacious as it is several years old.  He was diagnosed in May 2015 at [facility] and has not had a new CPAP machine since due to cost concerns.  He is prescribed CPAP at 20 cm of water.  According to that 2015 note his CPAP supplies were ordered and sent to [facility] in clinic which read it to the appropriate DME company.  He thinks that the DME company is no longer in business.  He continues to use Zepbound and is currently injecting 10 mg weekly.  He is due to start next package but would like to go ahead and escalate dose to the 12.5 mg as he is really not seen any decrease in food noise or appetite even at 10 mg weekly.  He continues to exercise regularly in efforts to reduce weight.  Sadly, he continues to have quite a bit of lower extremity edema and the left seems to be worse than the right most times.  He utilizes compression socks but this is not seem to help.  Reports bilateral feet pain that is somewhat relieved by diclofenac but never fully resolved.  3.  Concerns for primary biliary cholangitis Mother was diagnosed with the above recently.  They advised him to be checked out for this as well.  He would like to get written orders so that he can get these labs done for free through his work.  He has a recent lab work that was performed and he will get that faxed to me.  4.  Ear  pain Patient reports of bilateral ear pain, right greater than left.  He is utilizing his Flonase but not his Xyzal.  Symptoms onset in the last day.   ROS: Per HPI  No Known Allergies Past Medical History:  Diagnosis Date   Allergy    Asthma    HTN (hypertension)    no meds at this time   Pneumonia 10/2018   Sickle cell trait (HCC)    Sleep apnea    c-pap    Current Outpatient Medications:    atomoxetine (STRATTERA) 60 MG capsule, TAKE 1 CAPSULE BY MOUTH EVERY DAY, Disp: 90 capsule, Rfl: 3   beclomethasone (QVAR REDIHALER) 80 MCG/ACT inhaler, Inhale 2 puffs into the lungs 2 (two) times daily. To REPLACE Advair since not covered by ins, Disp: 3 each, Rfl: 12   escitalopram (LEXAPRO) 20 MG tablet, Take 1 tablet (20 mg total) by mouth daily., Disp: 90 tablet, Rfl: 3   fluticasone (FLONASE) 50 MCG/ACT nasal spray, Place 2 sprays into both nostrils daily., Disp: 16 g, Rfl: 6   levocetirizine (XYZAL) 5 MG tablet, Take 1 tablet (5 mg total) by mouth every evening., Disp: 90 tablet, Rfl: 3   ondansetron (ZOFRAN-ODT) 4 MG disintegrating tablet, Take 1 tablet (4 mg total) by mouth every 8 (eight) hours as needed for nausea or vomiting., Disp:  20 tablet, Rfl: 0   testosterone (ANDROGEL) 50 MG/5GM (1%) GEL, Place 5 g onto the skin daily. To the shoulders or upper arms, Disp: 150 g, Rfl: 3   tirzepatide (ZEPBOUND) 10 MG/0.5ML Pen, INJECT 10 MG INTO THE SKIN ONE TIME PER WEEK, Disp: 2 mL, Rfl: 0 Social History   Socioeconomic History   Marital status: Single    Spouse name: Not on file   Number of children: Not on file   Years of education: Not on file   Highest education level: Bachelor's degree (e.g., BA, AB, BS)  Occupational History   Occupation: student  Tobacco Use   Smoking status: Never   Smokeless tobacco: Never  Vaping Use   Vaping status: Never Used  Substance and Sexual Activity   Alcohol use: No    Comment: rare   Drug use: No   Sexual activity: Not on file  Other  Topics Concern   Not on file  Social History Narrative   Not on file   Social Drivers of Health   Financial Resource Strain: Low Risk  (12/23/2023)   Overall Financial Resource Strain (CARDIA)    Difficulty of Paying Living Expenses: Not very hard  Food Insecurity: No Food Insecurity (12/23/2023)   Hunger Vital Sign    Worried About Running Out of Food in the Last Year: Never true    Ran Out of Food in the Last Year: Never true  Transportation Needs: No Transportation Needs (12/23/2023)   PRAPARE - Administrator, Civil Service (Medical): No    Lack of Transportation (Non-Medical): No  Physical Activity: Sufficiently Active (12/23/2023)   Exercise Vital Sign    Days of Exercise per Week: 3 days    Minutes of Exercise per Session: 60 min  Stress: No Stress Concern Present (12/23/2023)   [employer] of Occupational Health - Occupational Stress Questionnaire    Feeling of Stress : Only a little  Social Connections: Moderately Integrated (12/23/2023)   Social Connection and Isolation Panel [NHANES]    Frequency of Communication with Friends and Family: More than three times a week    Frequency of Social Gatherings with Friends and Family: Twice a week    Attends Religious Services: 1 to 4 times per year    Active Member of [employer] or Organizations: No    Attends Engineer, structural: Not on file    Marital Status: Married  Intimate Partner Violence: Unknown (01/21/2022)   Received from [employer]   HITS    Physically Hurt: Not on file    Insult or Talk Down To: Not on file    Threaten Physical Harm: Not on file    Scream or Curse: Not on file   Family History  Problem Relation Age of Onset   Fibromyalgia Mother    Diabetes Father    Hypertension Father    Hypertension Sister    Hypertension Maternal Grandmother    Hypertension Maternal Grandfather    Alcohol abuse Paternal Grandmother    Heart disease Paternal Grandfather      Objective: Office vital signs reviewed. BP (!) 155/95   Pulse 97   Temp 98.5 F (36.9 C)   Wt (!) 401 lb (181.9 kg)   SpO2 95%   BMI 64.72 kg/m   Physical Examination:  General: Awake, alert, morbid obesity, No acute distress HEENT: Sclera white.  Moist mucous membranes.  TMs intact bilaterally with the right TM with appreciable increased erythema but no  bulging.  Clear effusions noted behind TMs bilaterally. Cardio: regular rate and rhythm, S1S2 heard, no murmurs appreciated Pulm: clear to auscultation bilaterally, no wheezes, rhonchi or rales; normal work of breathing on room air Extremities: warm, well perfused, pitting edema present that is most appreciated posteriorly in bilateral legs.  He has venous stasis changes bilaterally MSK: Antalgic gait but ambulating independently  Assessment/ Plan: 29 y.o. male   Class 3 severe obesity due to excess calories with serious comorbidity and body mass index (BMI) of 60.0 to 69.9 in adult [facility]) - Plan: tirzepatide (ZEPBOUND) 12.5 MG/0.5ML Pen, tirzepatide (ZEPBOUND) 15 MG/0.5ML Pen  Weight loss counseling, encounter for - Plan: tirzepatide (ZEPBOUND) 12.5 MG/0.5ML Pen, tirzepatide (ZEPBOUND) 15 MG/0.5ML Pen  Severe obstructive sleep apnea - Plan: tirzepatide (ZEPBOUND) 12.5 MG/0.5ML Pen, tirzepatide (ZEPBOUND) 15 MG/0.5ML Pen, Ambulatory referral to Sleep Studies  Essential hypertension - Plan: tirzepatide (ZEPBOUND) 12.5 MG/0.5ML Pen, tirzepatide (ZEPBOUND) 15 MG/0.5ML Pen, hydrochlorothiazide (HYDRODIURIL) 25 MG tablet  Bilateral leg edema - Plan: hydrochlorothiazide (HYDRODIURIL) 25 MG tablet  Mixed hyperlipidemia - Plan: tirzepatide (ZEPBOUND) 12.5 MG/0.5ML Pen, tirzepatide (ZEPBOUND) 15 MG/0.5ML Pen  Low testosterone in male - Plan: ToxASSURE Select 13 (MW), Urine, testosterone cypionate (DEPOTESTOSTERONE CYPIONATE) 200 MG/ML injection  Bilateral foot pain - Plan: Ambulatory referral to Podiatry  Eustachian tube  dysfunction, bilateral - Plan: fluticasone (FLONASE) 50 MCG/ACT nasal spray, levocetirizine (XYZAL) 5 MG tablet  Concern about cancer without diagnosis  Counseled the patient on advancing Zepbound to 12.5 mg this month and then proceeding with 15 mg as tolerated next month.  Sadly, he continues to suffer from a BMI that is greater than 64 kg/m.  He suffers from severe obstructive sleep apnea and I have placed a referral to sleep studies to aid in securing new CPAP machine.  I think that his lower extremity edema, uncontrolled hypertension and of course ongoing bilateral foot pain is a direct result of morbid obesity and his complications.  I am placing him on hydrochlorothiazide for the edema and blood pressure.  Hopefully getting a CPAP machine will also help him regulate his fluids at nighttime.  I have replaced his topical testosterone with injected testosterone which will be administered by his nurse at his work.  He will contact me if intramuscular needles are required.  UDS and CSC were updated as per office policy on this controlled substance.  He needs to follow-up with me in the next 3 months for interval checkup.  Written prescriptions for PSA, CBC, testosterone levels and CMP given  He was prescribed Flonase and Xyzal for what appears to be eustachian tube dysfunction.  No evidence of secondary bacterial infection  Orders for antimitochondrial antibody, ANA and CBC with differential provided to further evaluate concerns about PBC, which his mother was diagnosed with recently.  On his last lab draw that was sent to me via MyChart I did appreciate elevated LFTs but I think this is more likely to be related to morbid obesity and likely fatty liver deposition.  Total time spent with patient 47 minutes.  Greater than 50% of encounter spent in coordination of care/counseling.   Mason Ip, DO [facility] [phone]

## 2023-12-24 ENCOUNTER — Encounter: Payer: Self-pay | Admitting: Family Medicine

## 2023-12-24 MED ORDER — FLUTICASONE PROPIONATE 50 MCG/ACT NA SUSP
2.0000 | Freq: Every day | NASAL | 6 refills | Status: DC
Start: 2023-12-24 — End: 2024-06-08

## 2023-12-24 MED ORDER — TESTOSTERONE CYPIONATE 200 MG/ML IM SOLN: 200.0000 mg | INTRAMUSCULAR | 1 refills | Status: DC

## 2023-12-24 MED ORDER — LEVOCETIRIZINE DIHYDROCHLORIDE 5 MG PO TABS
5.0000 mg | ORAL_TABLET | Freq: Every evening | ORAL | 3 refills | Status: DC
Start: 2023-12-24 — End: 2024-06-08

## 2024-02-06 ENCOUNTER — Other Ambulatory Visit: Payer: Self-pay | Admitting: Family Medicine

## 2024-02-06 DIAGNOSIS — F411 Generalized anxiety disorder: Secondary | ICD-10-CM

## 2024-02-22 ENCOUNTER — Encounter: Payer: Self-pay | Admitting: Family Medicine

## 2024-03-07 ENCOUNTER — Encounter: Payer: Self-pay | Admitting: Family Medicine

## 2024-04-06 ENCOUNTER — Other Ambulatory Visit (HOSPITAL_COMMUNITY): Payer: Self-pay

## 2024-04-06 ENCOUNTER — Telehealth: Payer: Self-pay

## 2024-04-06 NOTE — Telephone Encounter (Signed)
 Pharmacy Patient Advocate Encounter   Received notification from CoverMyMeds that prior authorization for Zepbound  10 is required/requested.   Insurance verification completed.   The patient is insured through [employer] .   Per test claim: The current 84 day co-pay is, $24.99.  No PA needed at this time. This test claim was processed through [facility]- copay amounts may vary at other pharmacies due to pharmacy/plan contracts, or as the patient moves through the different stages of their insurance plan.

## 2024-04-06 NOTE — Telephone Encounter (Signed)
 Pt message sent notifying pt.

## 2024-04-24 ENCOUNTER — Other Ambulatory Visit: Payer: Self-pay | Admitting: Family Medicine

## 2024-04-24 DIAGNOSIS — M79671 Pain in right foot: Secondary | ICD-10-CM

## 2024-06-04 NOTE — Progress Notes (Unsigned)
 Subjective: CC:low testosterone  PCP: Jolinda Norene HERO, DO YEP:Mason Walker is a 28 y.o. male presenting to clinic today for:  1.  Low testosterone  Patient has been compliant with his testosterone  supplementation.  This is administered by a nurse at his work.  He is still having a little bit of fatigue.  2.  Morbid obesity associate with hyperlipidemia with bilateral lymphedema Had labs drawn recently and LDL was 155, HDL 39, triglycerides 201 and total 231.  He is compliant with Zepbound  and has been on 15 mg injected weekly for a few months.  He really has noticed no difference with this dose.  In fact he did much better on the Wegovy  but his insurance will no longer cover it.  His weight has pretty much stalled.  He continues to have lymphedema bilaterally and has attempted to get lymphedema pumps in the past but his insurance would not cover.  He is on HCTZ but this does not really seem to alleviate the swelling in his legs.  Today he reports that he has a red spot that is warm and somewhat hard on the right lower leg.  No fevers reported.   ROS: Per HPI  No Known Allergies Past Medical History:  Diagnosis Date   Allergy    Asthma    HTN (hypertension)    no meds at this time   Pneumonia 10/2018   Sickle cell trait (HCC)    Sleep apnea    c-pap    Current Outpatient Medications:    atomoxetine  (STRATTERA ) 60 MG capsule, TAKE 1 CAPSULE BY MOUTH EVERY DAY, Disp: 90 capsule, Rfl: 3   beclomethasone (QVAR  REDIHALER) 80 MCG/ACT inhaler, Inhale 2 puffs into the lungs 2 (two) times daily. To REPLACE Advair since not covered by ins, Disp: 3 each, Rfl: 12   escitalopram  (LEXAPRO ) 20 MG tablet, TAKE 1 TABLET BY MOUTH EVERY DAY, Disp: 90 tablet, Rfl: 3   fluticasone  (FLONASE ) 50 MCG/ACT nasal spray, Place 2 sprays into both nostrils daily., Disp: 16 g, Rfl: 6   hydrochlorothiazide  (HYDRODIURIL ) 25 MG tablet, Take 1 tablet (25 mg total) by mouth daily. For BP and swelling, Disp: 90  tablet, Rfl: 3   levocetirizine (XYZAL ) 5 MG tablet, Take 1 tablet (5 mg total) by mouth every evening., Disp: 90 tablet, Rfl: 3   ondansetron  (ZOFRAN -ODT) 4 MG disintegrating tablet, Take 1 tablet (4 mg total) by mouth every 8 (eight) hours as needed for nausea or vomiting., Disp: 20 tablet, Rfl: 0   testosterone  cypionate (DEPOTESTOSTERONE CYPIONATE) 200 MG/ML injection, Inject 1 mL (200 mg total) into the muscle every 14 (fourteen) days., Disp: 6 mL, Rfl: 1   tirzepatide  (ZEPBOUND ) 15 MG/0.5ML Pen, Inject 15 mg into the skin once a week., Disp: 6 mL, Rfl: 3   tirzepatide  (ZEPBOUND ) 12.5 MG/0.5ML Pen, Inject 12.5 mg into the skin once a week., Disp: 6 mL, Rfl: 4 Social History   Socioeconomic History   Marital status: Single    Spouse name: Not on file   Number of children: Not on file   Years of education: Not on file   Highest education level: Bachelor's degree (e.g., BA, AB, BS)  Occupational History   Occupation: student  Tobacco Use   Smoking status: Never   Smokeless tobacco: Never  Vaping Use   Vaping status: Never Used  Substance and Sexual Activity   Alcohol use: No    Comment: rare   Drug use: No   Sexual activity: Not on file  Other Topics Concern   Not on file  Social History Narrative   Not on file   Social Drivers of Health   Financial Resource Strain: Low Risk  (12/23/2023)   Overall Financial Resource Strain (CARDIA)    Difficulty of Paying Living Expenses: Not very hard  Food Insecurity: No Food Insecurity (12/23/2023)   Hunger Vital Sign    Worried About Running Out of Food in the Last Year: Never true    Ran Out of Food in the Last Year: Never true  Transportation Needs: No Transportation Needs (12/23/2023)   PRAPARE - Administrator, Civil Service (Medical): No    Lack of Transportation (Non-Medical): No  Physical Activity: Sufficiently Active (12/23/2023)   Exercise Vital Sign    Days of Exercise per Week: 3 days    Minutes of Exercise per  Session: 60 min  Stress: No Stress Concern Present (12/23/2023)   [employer] of Occupational Health - Occupational Stress Questionnaire    Feeling of Stress : Only a little  Social Connections: Moderately Integrated (12/23/2023)   Social Connection and Isolation Panel    Frequency of Communication with Friends and Family: More than three times a week    Frequency of Social Gatherings with Friends and Family: Twice a week    Attends Religious Services: 1 to 4 times per year    Active Member of [employer] or Organizations: No    Attends Engineer, structural: Not on file    Marital Status: Married  Intimate Partner Violence: Unknown (01/21/2022)   Received from [facility]   HITS    Physically Hurt: Not on file    Insult or Talk Down To: Not on file    Threaten Physical Harm: Not on file    Scream or Curse: Not on file   Family History  Problem Relation Age of Onset   Fibromyalgia Mother    Diabetes Father    Hypertension Father    Hypertension Sister    Hypertension Maternal Grandmother    Hypertension Maternal Grandfather    Alcohol abuse Paternal Grandmother    Heart disease Paternal Grandfather     Objective: Office vital signs reviewed. BP 132/87   Pulse 91   Temp 98.3 F (36.8 C)   Ht 5' 6 (1.676 m)   Wt (!) 401 lb (181.9 kg)   SpO2 93%   BMI 64.72 kg/m   Physical Examination:  General: Awake, alert, super morbidly obese, No acute distress HEENT: Sclera white.  Moist mucous membranes Cardio: regular rate and rhythm, S1S2 heard, no murmurs appreciated Pulm: clear to auscultation bilaterally, no wheezes, rhonchi or rales; normal work of breathing on room air Extremities: Nonpitting lymphedema present to bilateral lower extremities.  He does have a warm, moderately erythematous area along the medial aspect of the right lower leg.  No weeping.  No pustular or vesicular formation appreciated.     12/23/2023    1:01 PM 09/06/2023    3:16 PM 01/19/2023    4:19  PM  Depression screen PHQ 2/9  Decreased Interest 0 0 0  Down, Depressed, Hopeless 0 0 0  PHQ - 2 Score 0 0 0  Altered sleeping 0 0 0  Tired, decreased energy 0 1 0  Change in appetite 0 1 0  Feeling bad or failure about yourself  0 0 0  Trouble concentrating 0 0 0  Moving slowly or fidgety/restless 0 0 0  Suicidal thoughts 0 0 0  PHQ-9 Score  0 2 0  Difficult doing work/chores Not difficult at all Not difficult at all Not difficult at all      12/23/2023    1:01 PM 09/06/2023    3:16 PM 01/19/2023    4:19 PM 11/09/2022    8:50 AM  GAD 7 : Generalized Anxiety Score  Nervous, Anxious, on Edge 0 1 0 0  Control/stop worrying 0 0 0 1  Worry too much - different things 0 0 0 1  Trouble relaxing 0 0 0 0  Restless 0 0 0 0  Easily annoyed or irritable 0 1 0 1  Afraid - awful might happen 0 0 0 0  Total GAD 7 Score 0 2 0 3  Anxiety Difficulty Not difficult at all Not difficult at all Not difficult at all Somewhat difficult   Assessment/ Plan: 28 y.o. male   Low testosterone  in male - Plan: ToxASSURE Select 13 (MW), Urine, testosterone  cypionate (DEPOTESTOSTERONE CYPIONATE) 200 MG/ML injection  Controlled substance agreement signed - Plan: ToxASSURE Select 13 (MW), Urine  Class 3 severe obesity due to excess calories with serious comorbidity and body mass index (BMI) of 60.0 to 69.9 in adult - Plan: tirzepatide  (ZEPBOUND ) 15 MG/0.5ML Pen  Weight loss counseling, encounter for - Plan: tirzepatide  (ZEPBOUND ) 15 MG/0.5ML Pen  Severe obstructive sleep apnea - Plan: tirzepatide  (ZEPBOUND ) 15 MG/0.5ML Pen  Mixed hyperlipidemia - Plan: tirzepatide  (ZEPBOUND ) 15 MG/0.5ML Pen  Essential hypertension - Plan: hydrochlorothiazide  (HYDRODIURIL ) 25 MG tablet, tirzepatide  (ZEPBOUND ) 15 MG/0.5ML Pen  Lymphedema - Plan: hydrochlorothiazide  (HYDRODIURIL ) 25 MG tablet, doxycycline  (VIBRA -TABS) 100 MG tablet  Attention deficit hyperactivity disorder (ADHD), predominantly hyperactive type - Plan:  atomoxetine  (STRATTERA ) 60 MG capsule  Mild intermittent asthma without complication - Plan: beclomethasone (QVAR  REDIHALER) 80 MCG/ACT inhaler  Generalized anxiety disorder - Plan: escitalopram  (LEXAPRO ) 20 MG tablet  Eustachian tube dysfunction, bilateral - Plan: fluticasone  (FLONASE ) 50 MCG/ACT nasal spray, levocetirizine (XYZAL ) 5 MG tablet  No change in weight since previous check up.  This is despite advancing dose of Zepbound  to 15 mg weekly.  I am going to see if we might give it a try and get a tier exception for the Wegovy  since this seemed to work better.  We discussed alternative would really be referral to bariatric surgery at this point because there is really nothing that is better on the market than the GIP and GLP.  Free testosterone  was low but total testosterone  still pending.  If he is not absorbing this injected testosterone  appropriately, we discussed we strongly may need to consider referring him to endocrinology versus urology for alternative treatment plan.  UDS/ CSC updated.  The Narcotic Database has been reviewed.  There were no red flags.    Discussed that lymphedema was likely related to morbid obesity and would unlikely respond to change in diuretic that would be glad to try him on Lasix .  He declined this today. Suspected early cellulitis today treated with doxy.  Did not discuss anxiety, eustachian tube dysfunction, asthma or ADHD but needed refills on all of these medications.  10m f/u CPE/ testosterone  refill   Norene CHRISTELLA Fielding, DO [facility] [phone]

## 2024-06-08 ENCOUNTER — Telehealth: Payer: Self-pay | Admitting: Family Medicine

## 2024-06-08 ENCOUNTER — Ambulatory Visit: Admitting: Family Medicine

## 2024-06-08 ENCOUNTER — Encounter: Payer: Self-pay | Admitting: Family Medicine

## 2024-06-08 VITALS — BP 132/87 | HR 91 | Temp 98.3°F | Ht 66.0 in | Wt >= 6400 oz

## 2024-06-08 DIAGNOSIS — Z79899 Other long term (current) drug therapy: Secondary | ICD-10-CM | POA: Diagnosis not present

## 2024-06-08 DIAGNOSIS — E66813 Obesity, class 3: Secondary | ICD-10-CM | POA: Diagnosis not present

## 2024-06-08 DIAGNOSIS — Z713 Dietary counseling and surveillance: Secondary | ICD-10-CM

## 2024-06-08 DIAGNOSIS — J4520 Mild intermittent asthma, uncomplicated: Secondary | ICD-10-CM

## 2024-06-08 DIAGNOSIS — F901 Attention-deficit hyperactivity disorder, predominantly hyperactive type: Secondary | ICD-10-CM

## 2024-06-08 DIAGNOSIS — R7989 Other specified abnormal findings of blood chemistry: Secondary | ICD-10-CM

## 2024-06-08 DIAGNOSIS — G4733 Obstructive sleep apnea (adult) (pediatric): Secondary | ICD-10-CM

## 2024-06-08 DIAGNOSIS — H6993 Unspecified Eustachian tube disorder, bilateral: Secondary | ICD-10-CM

## 2024-06-08 DIAGNOSIS — Z6841 Body Mass Index (BMI) 40.0 and over, adult: Secondary | ICD-10-CM

## 2024-06-08 DIAGNOSIS — I10 Essential (primary) hypertension: Secondary | ICD-10-CM

## 2024-06-08 DIAGNOSIS — F411 Generalized anxiety disorder: Secondary | ICD-10-CM

## 2024-06-08 DIAGNOSIS — I890 Lymphedema, not elsewhere classified: Secondary | ICD-10-CM

## 2024-06-08 DIAGNOSIS — E782 Mixed hyperlipidemia: Secondary | ICD-10-CM

## 2024-06-08 LAB — LAB REPORT - SCANNED
EGFR: 108
TSH: 1.69 (ref 0.41–5.90)

## 2024-06-08 MED ORDER — FLUTICASONE PROPIONATE 50 MCG/ACT NA SUSP: 2.0000 | Freq: Every day | NASAL | 6 refills | Status: AC

## 2024-06-08 MED ORDER — TESTOSTERONE CYPIONATE 200 MG/ML IM SOLN: 200.0000 mg | INTRAMUSCULAR | 1 refills | Status: AC

## 2024-06-08 MED ORDER — DOXYCYCLINE HYCLATE 100 MG PO TABS: 100.0000 mg | ORAL_TABLET | Freq: Two times a day (BID) | ORAL | 0 refills | Status: AC

## 2024-06-08 MED ORDER — TIRZEPATIDE-WEIGHT MANAGEMENT 15 MG/0.5ML ~~LOC~~ SOAJ: 15.0000 mg | SUBCUTANEOUS | 3 refills | Status: DC

## 2024-06-08 MED ORDER — ATOMOXETINE HCL 60 MG PO CAPS: ORAL_CAPSULE | ORAL | 3 refills | Status: AC

## 2024-06-08 MED ORDER — HYDROCHLOROTHIAZIDE 25 MG PO TABS: 25.0000 mg | ORAL_TABLET | Freq: Every day | ORAL | 3 refills | Status: AC

## 2024-06-08 MED ORDER — QVAR REDIHALER 80 MCG/ACT IN AERB: 2.0000 | INHALATION_SPRAY | Freq: Two times a day (BID) | RESPIRATORY_TRACT | 12 refills | Status: AC

## 2024-06-08 MED ORDER — LEVOCETIRIZINE DIHYDROCHLORIDE 5 MG PO TABS: 5.0000 mg | ORAL_TABLET | Freq: Every evening | ORAL | 3 refills | Status: AC

## 2024-06-08 MED ORDER — ESCITALOPRAM OXALATE 20 MG PO TABS: 20.0000 mg | ORAL_TABLET | Freq: Every day | ORAL | 3 refills | Status: AC

## 2024-06-08 NOTE — Telephone Encounter (Signed)
 Copied from CRM #[id number]. Topic: Clinical - Medical Advice >> Jun 08, 2024  1:01 PM Carlatta H wrote: Reason for CRM: Please call to advise patient if has to have his Testerone testing back for his appointment today at 2

## 2024-06-08 NOTE — Telephone Encounter (Signed)
 Patients appt was at [facility] today. Already discussed. Will close this message.

## 2024-06-08 NOTE — Addendum Note (Signed)
 Addended by: JOLINDA NORENE HERO on: 06/08/2024 02:44 PM   Modules accepted: Orders

## 2024-06-12 ENCOUNTER — Encounter: Payer: Self-pay | Admitting: Family Medicine

## 2024-06-12 LAB — TOXASSURE SELECT 13 (MW), URINE

## 2024-06-13 ENCOUNTER — Ambulatory Visit: Payer: Self-pay | Admitting: Family Medicine

## 2024-06-19 ENCOUNTER — Encounter: Payer: Self-pay | Admitting: Pharmacist

## 2024-06-19 ENCOUNTER — Telehealth: Payer: Self-pay | Admitting: Pharmacist

## 2024-06-19 NOTE — Telephone Encounter (Signed)
  Message sent to member with the following information:

## 2024-06-21 ENCOUNTER — Encounter: Payer: Self-pay | Admitting: Pharmacist

## 2024-06-26 ENCOUNTER — Encounter (INDEPENDENT_AMBULATORY_CARE_PROVIDER_SITE_OTHER): Payer: Self-pay | Admitting: Family Medicine

## 2024-06-26 DIAGNOSIS — E66813 Obesity, class 3: Secondary | ICD-10-CM

## 2024-06-26 DIAGNOSIS — I10 Essential (primary) hypertension: Secondary | ICD-10-CM

## 2024-06-26 DIAGNOSIS — E782 Mixed hyperlipidemia: Secondary | ICD-10-CM

## 2024-06-26 DIAGNOSIS — I890 Lymphedema, not elsewhere classified: Secondary | ICD-10-CM | POA: Diagnosis not present

## 2024-06-26 DIAGNOSIS — G4733 Obstructive sleep apnea (adult) (pediatric): Secondary | ICD-10-CM

## 2024-06-26 DIAGNOSIS — Z713 Dietary counseling and surveillance: Secondary | ICD-10-CM

## 2024-06-26 DIAGNOSIS — G5603 Carpal tunnel syndrome, bilateral upper limbs: Secondary | ICD-10-CM | POA: Diagnosis not present

## 2024-06-26 MED ORDER — SULFAMETHOXAZOLE-TRIMETHOPRIM 800-160 MG PO TABS: 1.0000 | ORAL_TABLET | Freq: Two times a day (BID) | ORAL | 0 refills | Status: AC

## 2024-06-26 NOTE — Telephone Encounter (Signed)
 Septra  sent. Will NTBS if persistent issue.  No idea about PA. You may want to reach out to the PA team

## 2024-07-06 MED ORDER — TIRZEPATIDE-WEIGHT MANAGEMENT 15 MG/0.5ML ~~LOC~~ SOAJ: 15.0000 mg | SUBCUTANEOUS | 3 refills | Status: AC

## 2024-07-06 NOTE — Addendum Note (Signed)
 Addended by: JOLINDA NORENE HERO on: 07/06/2024 01:48 PM   Modules accepted: Orders

## 2024-07-09 ENCOUNTER — Encounter: Payer: Self-pay | Admitting: Family Medicine

## 2024-07-09 DIAGNOSIS — N539 Unspecified male sexual dysfunction: Secondary | ICD-10-CM

## 2024-07-09 MED ORDER — BUPROPION HCL ER (SR) 150 MG PO TB12: 150.0000 mg | ORAL_TABLET | Freq: Every morning | ORAL | 3 refills | Status: AC

## 2024-07-10 ENCOUNTER — Other Ambulatory Visit: Payer: Self-pay | Admitting: Family Medicine

## 2024-07-10 DIAGNOSIS — M79671 Pain in right foot: Secondary | ICD-10-CM

## 2024-07-10 MED ORDER — DICLOFENAC SODIUM 75 MG PO TBEC: 75.0000 mg | DELAYED_RELEASE_TABLET | Freq: Two times a day (BID) | ORAL | 3 refills | Status: AC | PRN

## 2024-07-19 ENCOUNTER — Other Ambulatory Visit (HOSPITAL_COMMUNITY): Payer: Self-pay

## 2024-07-19 ENCOUNTER — Telehealth: Payer: Self-pay

## 2024-07-19 NOTE — Telephone Encounter (Signed)
 I called and spoke with patient and made him aware that new PA has been submitted. Patient voiced understanding.

## 2024-07-19 NOTE — Telephone Encounter (Signed)
 Pharmacy Patient Advocate Encounter   Received notification from Patient Advice Request messages that prior authorization for Zepbound  15MG /0.5ML pen-injectors  is required/requested.   Insurance verification completed.   The patient is insured through [employer].   Per test claim: PA required; PA submitted to above mentioned insurance via Latent Key/confirmation #/EOC [id number] Status is pending

## 2024-07-25 ENCOUNTER — Other Ambulatory Visit (HOSPITAL_COMMUNITY): Payer: Self-pay

## 2024-07-25 NOTE — Telephone Encounter (Signed)
 Called and spoke with patient and made him aware of Zepbound  PA approval.

## 2024-07-25 NOTE — Telephone Encounter (Signed)
 Pharmacy Patient Advocate Encounter  Received notification from [employer] that Prior Authorization for Zepbound  15MG /0.5ML pen-injectors has been APPROVED from 06/19/24 to 07/25/25   PA #/Case ID/Reference #: [id number]

## 2024-07-25 NOTE — Telephone Encounter (Signed)
 Called ins to follow up on PA request. PA is still pending review. Please be advised that most companies allow up to 30 days to make a decision. We will advise when a determination has been made, or follow up in 1 week.   Please reach out to our team, Rx Prior Auth Pool, if you haven't heard back in a week.

## 2024-07-30 ENCOUNTER — Telehealth: Payer: Self-pay | Admitting: *Deleted

## 2024-07-30 NOTE — Telephone Encounter (Signed)
 Copied from CRM [id number]. Topic: Clinical - Medication Prior Auth >> Jul 30, 2024  1:21 PM Diannia H wrote: Reason for CRM: Patient called and stated his Zepbound  15MG /0.5ML pen-injectors was approved but the provider or nurse would have to call the pharmacy to get it filled. Could you assist?   [id number] - [place], [place] - [address] [place] [address] Phone: [phone] Fax: [phone] Hours: Not open 24 hours

## 2024-07-30 NOTE — Telephone Encounter (Signed)
 Called pharmacy and detailed voice message left with Prior Auth approval info.

## 2024-08-03 NOTE — Telephone Encounter (Signed)
Please see the MyChart message reply(ies) for my assessment and plan.    This patient gave consent for this Medical Advice Message and is aware that it may result in a bill to [employer], as well as the possibility of receiving a bill for a co-payment or deductible. They are an established patient, but are not seeking medical advice exclusively about a problem treated during an in person or video visit in the last seven days. I did not recommend an in person or video visit within seven days of my reply.    I spent a total of 11 minutes cumulative time within 7 days through [employer].  Ronnie Doss, DO

## 2024-08-06 ENCOUNTER — Other Ambulatory Visit (HOSPITAL_COMMUNITY): Payer: Self-pay

## 2024-08-14 ENCOUNTER — Other Ambulatory Visit (HOSPITAL_COMMUNITY): Payer: Self-pay

## 2025-02-04 ENCOUNTER — Encounter: Payer: Self-pay | Admitting: Family Medicine
